# Patient Record
Sex: Female | Born: 1935 | Race: White | Hispanic: No | Marital: Married | Smoking: Former smoker
Health system: Southern US, Community
[De-identification: ages and names within clinical notes are randomized; demographics above are authoritative.]

## PROBLEM LIST (undated history)

## (undated) ENCOUNTER — Emergency Department (HOSPITAL_BASED_OUTPATIENT_CLINIC_OR_DEPARTMENT_OTHER): Admission: EM | Payer: Medicare Other | Source: Home / Self Care

## (undated) DIAGNOSIS — M8580 Other specified disorders of bone density and structure, unspecified site: Secondary | ICD-10-CM

## (undated) DIAGNOSIS — K635 Polyp of colon: Secondary | ICD-10-CM

## (undated) DIAGNOSIS — R011 Cardiac murmur, unspecified: Secondary | ICD-10-CM

## (undated) DIAGNOSIS — H35372 Puckering of macula, left eye: Secondary | ICD-10-CM

## (undated) DIAGNOSIS — K319 Disease of stomach and duodenum, unspecified: Secondary | ICD-10-CM

## (undated) DIAGNOSIS — I10 Essential (primary) hypertension: Secondary | ICD-10-CM

## (undated) DIAGNOSIS — H59032 Cystoid macular edema following cataract surgery, left eye: Secondary | ICD-10-CM

## (undated) DIAGNOSIS — C801 Malignant (primary) neoplasm, unspecified: Secondary | ICD-10-CM

## (undated) HISTORY — DX: Disease of stomach and duodenum, unspecified: K31.9

## (undated) HISTORY — DX: Puckering of macula, left eye: H35.372

## (undated) HISTORY — PX: TONSILLECTOMY: SHX5217

## (undated) HISTORY — DX: Cardiac murmur, unspecified: R01.1

## (undated) HISTORY — DX: Polyp of colon: K63.5

## (undated) HISTORY — PX: BREAST BIOPSY: SHX20

## (undated) HISTORY — PX: BREAST SURGERY: SHX581

## (undated) HISTORY — DX: Other specified disorders of bone density and structure, unspecified site: M85.80

## (undated) HISTORY — PX: APPENDECTOMY: SHX54

## (undated) HISTORY — PX: ABDOMINAL HYSTERECTOMY: SHX81

## (undated) HISTORY — DX: Cystoid macular edema following cataract surgery, left eye: H59.032

## (undated) HISTORY — DX: Essential (primary) hypertension: I10

---

## 2005-04-21 ENCOUNTER — Ambulatory Visit: Payer: Self-pay | Admitting: General Surgery

## 2006-04-27 ENCOUNTER — Ambulatory Visit: Payer: Self-pay | Admitting: General Surgery

## 2007-04-29 ENCOUNTER — Ambulatory Visit: Payer: Self-pay | Admitting: General Surgery

## 2008-05-02 ENCOUNTER — Ambulatory Visit: Payer: Self-pay | Admitting: General Surgery

## 2009-05-04 ENCOUNTER — Ambulatory Visit: Payer: Self-pay | Admitting: General Surgery

## 2010-05-15 ENCOUNTER — Ambulatory Visit: Payer: Self-pay | Admitting: Surgery

## 2011-05-21 ENCOUNTER — Ambulatory Visit: Payer: Self-pay | Admitting: Surgery

## 2012-05-24 ENCOUNTER — Ambulatory Visit: Payer: Self-pay | Admitting: Surgery

## 2012-07-27 ENCOUNTER — Ambulatory Visit: Payer: Self-pay | Admitting: Internal Medicine

## 2012-08-13 LAB — HM COLONOSCOPY

## 2012-08-23 ENCOUNTER — Ambulatory Visit: Payer: Self-pay | Admitting: Gastroenterology

## 2012-12-16 ENCOUNTER — Ambulatory Visit: Payer: Self-pay | Admitting: Internal Medicine

## 2013-01-06 ENCOUNTER — Ambulatory Visit: Payer: Self-pay | Admitting: Internal Medicine

## 2013-04-13 ENCOUNTER — Ambulatory Visit: Payer: Self-pay | Admitting: Internal Medicine

## 2013-06-08 ENCOUNTER — Ambulatory Visit: Payer: Self-pay | Admitting: Family Medicine

## 2013-06-08 LAB — HM MAMMOGRAPHY

## 2014-06-13 ENCOUNTER — Ambulatory Visit: Payer: Self-pay | Admitting: Surgery

## 2015-01-25 LAB — HM DEXA SCAN

## 2015-04-03 ENCOUNTER — Other Ambulatory Visit: Payer: Self-pay

## 2015-04-16 ENCOUNTER — Ambulatory Visit: Admission: RE | Admit: 2015-04-16 | Payer: Self-pay | Source: Ambulatory Visit | Admitting: Orthopedic Surgery

## 2015-04-16 ENCOUNTER — Encounter: Admission: RE | Payer: Self-pay | Source: Ambulatory Visit

## 2015-04-16 SURGERY — ARTHROSCOPY, KNEE
Anesthesia: Choice | Laterality: Right

## 2015-07-06 ENCOUNTER — Other Ambulatory Visit: Payer: Self-pay | Admitting: Surgery

## 2015-07-06 DIAGNOSIS — Z1231 Encounter for screening mammogram for malignant neoplasm of breast: Secondary | ICD-10-CM

## 2015-07-12 ENCOUNTER — Other Ambulatory Visit: Payer: Self-pay | Admitting: Surgery

## 2015-07-12 ENCOUNTER — Ambulatory Visit
Admission: RE | Admit: 2015-07-12 | Discharge: 2015-07-12 | Disposition: A | Discharge status: Home / Self Care | Payer: Medicare Other | Source: Ambulatory Visit | Attending: Surgery | Admitting: Surgery

## 2015-07-12 DIAGNOSIS — Z1231 Encounter for screening mammogram for malignant neoplasm of breast: Secondary | ICD-10-CM

## 2015-07-12 HISTORY — DX: Malignant (primary) neoplasm, unspecified: C80.1

## 2015-08-06 ENCOUNTER — Encounter: Payer: Self-pay | Admitting: Family Medicine

## 2015-08-06 ENCOUNTER — Other Ambulatory Visit: Payer: Self-pay

## 2015-08-06 ENCOUNTER — Other Ambulatory Visit: Payer: Self-pay | Admitting: Family Medicine

## 2015-08-06 ENCOUNTER — Telehealth: Payer: Self-pay

## 2015-08-06 DIAGNOSIS — Z5181 Encounter for therapeutic drug level monitoring: Secondary | ICD-10-CM | POA: Insufficient documentation

## 2015-08-06 DIAGNOSIS — M8580 Other specified disorders of bone density and structure, unspecified site: Secondary | ICD-10-CM

## 2015-08-06 DIAGNOSIS — I10 Essential (primary) hypertension: Secondary | ICD-10-CM | POA: Insufficient documentation

## 2015-08-06 NOTE — Assessment & Plan Note (Signed)
Check vit D 

## 2015-08-06 NOTE — Assessment & Plan Note (Signed)
Check CMP.  ?

## 2015-08-06 NOTE — Telephone Encounter (Signed)
Patient thought she was supposed to have come back in for labs after she was seen here in Jan. I looked in PP but I did not see anything. She wants to know if she can get some labs done and then come in for her follow up.

## 2015-08-07 ENCOUNTER — Telehealth: Payer: Self-pay | Admitting: Family Medicine

## 2015-08-07 LAB — COMPREHENSIVE METABOLIC PANEL
ALBUMIN: 4 g/dL (ref 3.5–4.7)
ALT: 22 IU/L (ref 0–32)
AST: 26 IU/L (ref 0–40)
Albumin/Globulin Ratio: 1.7 (ref 1.1–2.5)
Alkaline Phosphatase: 45 IU/L (ref 39–117)
BUN / CREAT RATIO: 21 (ref 11–26)
BUN: 20 mg/dL (ref 8–27)
Bilirubin Total: 0.3 mg/dL (ref 0.0–1.2)
CALCIUM: 9.3 mg/dL (ref 8.7–10.3)
CHLORIDE: 101 mmol/L (ref 97–108)
CO2: 26 mmol/L (ref 18–29)
Creatinine, Ser: 0.94 mg/dL (ref 0.57–1.00)
GFR, EST AFRICAN AMERICAN: 65 mL/min/{1.73_m2} (ref >59)
GFR, EST NON AFRICAN AMERICAN: 56 mL/min/{1.73_m2} — AB (ref >59)
GLUCOSE: 84 mg/dL (ref 65–99)
Globulin, Total: 2.4 g/dL (ref 1.5–4.5)
Potassium: 4.3 mmol/L (ref 3.5–5.2)
Sodium: 142 mmol/L (ref 134–144)
TOTAL PROTEIN: 6.4 g/dL (ref 6.0–8.5)

## 2015-08-07 LAB — VITAMIN D 25 HYDROXY (VIT D DEFICIENCY, FRACTURES): Vit D, 25-Hydroxy: 38.5 ng/mL (ref 30.0–100.0)

## 2015-08-07 NOTE — Telephone Encounter (Signed)
Please let patient know that her vitamin D is normal Her glucose (sugar) is fine; her liver function tests are normal We like the GFR (measure of kidney function) to be over 59 and hers is just mildly reduced at 8 Reassure her that a GFR of 14 at 81 years of age is pretty good! We have people in their 51s and 78s whose kidneys don't work as well as hers do Avoid NSAIDs; stay hydrated

## 2015-08-08 NOTE — Telephone Encounter (Signed)
Left message to call.

## 2015-08-08 NOTE — Telephone Encounter (Signed)
Patient notified

## 2015-08-16 DIAGNOSIS — I10 Essential (primary) hypertension: Secondary | ICD-10-CM | POA: Insufficient documentation

## 2015-08-20 ENCOUNTER — Ambulatory Visit (INDEPENDENT_AMBULATORY_CARE_PROVIDER_SITE_OTHER): Payer: Medicare Other | Admitting: Family Medicine

## 2015-08-20 ENCOUNTER — Encounter: Payer: Self-pay | Admitting: Family Medicine

## 2015-08-20 VITALS — BP 154/83 | HR 69 | Temp 97.6°F | Wt 144.0 lb

## 2015-08-20 DIAGNOSIS — M8580 Other specified disorders of bone density and structure, unspecified site: Secondary | ICD-10-CM | POA: Diagnosis not present

## 2015-08-20 DIAGNOSIS — I10 Essential (primary) hypertension: Secondary | ICD-10-CM

## 2015-08-20 NOTE — Progress Notes (Signed)
   BP 154/83 mmHg  Pulse 69  Temp(Src) 97.6 F (36.4 C)  Wt 144 lb (65.318 kg)  SpO2 98%   Subjective:    Patient ID: Kayla Mayer, female    DOB: May 17, 1930, 81 y.o.   MRN: [id number]  HPI: Kayla Mayer is a 81 y.o. female  Chief Complaint  Patient presents with  . Hypertension  . Discuss Labs   She had labs done and we reviewed those together  She does not get regular exercise; not yet a member of [employer]  She has a messed up meniscus in the right knee; had surgery scheduled but had to postpone it; had MRI done on the right knee already; meniscus; she is not doing glucosamine; it is a tear from injury  High blood pressure, much better control at home; not taking but 5 mg of medicine a day; she will go up to 10 mg of med when higher than usual; good eater  Hx of high cholesterol; did dietary changes and walked and brought it down; never went back up  Relevant past medical, surgical, family and social history reviewed and updated as indicated. Interim medical history since our last visit reviewed. Allergies and medications reviewed and updated.  Review of Systems Per HPI unless specifically indicated above     Objective:    BP 154/83 mmHg  Pulse 69  Temp(Src) 97.6 F (36.4 C)  Wt 144 lb (65.318 kg)  SpO2 98%  Wt Readings from Last 3 Encounters:  08/20/15 144 lb (65.318 kg)  12/07/14 149 lb (67.586 kg)    Physical Exam  Constitutional: She appears well-developed and well-nourished.  Eyes: EOM are normal. No scleral icterus.  Cardiovascular: Normal rate and regular rhythm.   Pulmonary/Chest: Effort normal and breath sounds normal.  Skin: No bruising and no ecchymosis noted.  Psychiatric: She has a normal mood and affect. Her speech is normal and behavior is normal. Judgment and thought content normal. Cognition and memory are normal.    Results for orders placed or performed in visit on 08/16/15  HM MAMMOGRAPHY  Result Value Ref Range   HM Mammogram  per PP   HM DEXA SCAN  Result Value Ref Range   HM Dexa Scan per PP   HM COLONOSCOPY  Result Value Ref Range   HM Colonoscopy per PP       Assessment & Plan:   Problem List Items Addressed This Visit      Cardiovascular and Mediastinum   Essential hypertension, benign - Primary    Discussed, slightly above normal; DASH guidelines; she adjusts her ACE-I if pressures running up; she monitors closely at home; reviewed last creatinine and K+        Musculoskeletal and Integument   Osteopenia    Reviewed fall precautions; DEXA scan results; calcium 3 servings a day         Follow up plan: Return in about 6 months (around 02/18/2016).

## 2015-08-24 NOTE — Assessment & Plan Note (Signed)
Reviewed fall precautions; DEXA scan results; calcium 3 servings a day

## 2015-08-24 NOTE — Patient Instructions (Signed)
Computer went down during her visit. No AVS printed at the time of her appointment.

## 2015-08-24 NOTE — Assessment & Plan Note (Signed)
Discussed, slightly above normal; DASH guidelines; she adjusts her ACE-I if pressures running up; she monitors closely at home; reviewed last creatinine and K+

## 2015-09-11 HISTORY — PX: SKIN SURGERY: SHX2413

## 2015-10-03 ENCOUNTER — Ambulatory Visit: Payer: Medicare Other

## 2015-10-03 DIAGNOSIS — Z23 Encounter for immunization: Secondary | ICD-10-CM

## 2015-11-10 ENCOUNTER — Emergency Department
Admission: EM | Admit: 2015-11-10 | Discharge: 2015-11-10 | Disposition: A | Discharge status: Home / Self Care | Payer: Medicare Other | Attending: Emergency Medicine | Admitting: Emergency Medicine

## 2015-11-10 ENCOUNTER — Encounter: Payer: Self-pay | Admitting: Emergency Medicine

## 2015-11-10 DIAGNOSIS — T7840XA Allergy, unspecified, initial encounter: Secondary | ICD-10-CM | POA: Diagnosis present

## 2015-11-10 DIAGNOSIS — Y9389 Activity, other specified: Secondary | ICD-10-CM | POA: Insufficient documentation

## 2015-11-10 DIAGNOSIS — Y9289 Other specified places as the place of occurrence of the external cause: Secondary | ICD-10-CM | POA: Insufficient documentation

## 2015-11-10 DIAGNOSIS — I10 Essential (primary) hypertension: Secondary | ICD-10-CM | POA: Insufficient documentation

## 2015-11-10 DIAGNOSIS — Z79899 Other long term (current) drug therapy: Secondary | ICD-10-CM | POA: Insufficient documentation

## 2015-11-10 DIAGNOSIS — Y998 Other external cause status: Secondary | ICD-10-CM | POA: Diagnosis not present

## 2015-11-10 DIAGNOSIS — Z91040 Latex allergy status: Secondary | ICD-10-CM | POA: Insufficient documentation

## 2015-11-10 DIAGNOSIS — L509 Urticaria, unspecified: Secondary | ICD-10-CM | POA: Diagnosis not present

## 2015-11-10 DIAGNOSIS — X58XXXA Exposure to other specified factors, initial encounter: Secondary | ICD-10-CM | POA: Diagnosis not present

## 2015-11-10 DIAGNOSIS — Z87891 Personal history of nicotine dependence: Secondary | ICD-10-CM | POA: Insufficient documentation

## 2015-11-10 MED ORDER — DIPHENHYDRAMINE HCL 25 MG PO CAPS
25.0000 mg | ORAL_CAPSULE | Freq: Once | ORAL | Status: AC
  Administered 2015-11-10: 25 mg via ORAL

## 2015-11-10 MED ORDER — DIPHENHYDRAMINE HCL 25 MG PO CAPS
ORAL_CAPSULE | ORAL | Status: AC
  Administered 2015-11-10: 25 mg via ORAL
  Filled 2015-11-10: qty 1

## 2015-11-10 MED ORDER — FAMOTIDINE 20 MG PO TABS: 20.0000 mg | ORAL_TABLET | Freq: Two times a day (BID) | ORAL | Status: DC

## 2015-11-10 MED ORDER — FAMOTIDINE 20 MG PO TABS
20.0000 mg | ORAL_TABLET | Freq: Once | ORAL | Status: AC
  Administered 2015-11-10: 20 mg via ORAL
  Filled 2015-11-10: qty 1

## 2015-11-10 MED ORDER — PREDNISONE 20 MG PO TABS: ORAL_TABLET | ORAL | Status: AC

## 2015-11-10 MED ORDER — PREDNISONE 20 MG PO TABS
30.0000 mg | ORAL_TABLET | Freq: Once | ORAL | Status: AC
  Administered 2015-11-10: 30 mg via ORAL
  Filled 2015-11-10: qty 1

## 2015-11-10 NOTE — Discharge Instructions (Signed)
1. Take antibiotic as prescribed (Penicillin VK 500mg  four times daily x 7 days). 2. Take pain medicines as needed (Motrin/Percocet #15). 3. Return to the ER for worsening symptoms, persistent vomiting, fever, difficulty breathing or other concerns.  Dental Pain A tooth ache may be caused by cavities (tooth decay). Cavities expose the nerve of the tooth to air and hot or cold temperatures. It may come from an infection or abscess (also called a boil or furuncle) around your tooth. It is also often caused by dental caries (tooth decay). This causes the pain you are having. DIAGNOSIS  Your caregiver can diagnose this problem by exam. TREATMENT   If caused by an infection, it may be treated with medications which kill germs (antibiotics) and pain medications as prescribed by your caregiver. Take medications as directed.  Only take over-the-counter or prescription medicines for pain, discomfort, or fever as directed by your caregiver.  Whether the tooth ache today is caused by infection or dental disease, you should see your dentist as soon as possible for further care. SEEK MEDICAL CARE IF: The exam and treatment you received today has been provided on an emergency basis only. This is not a substitute for complete medical or dental care. If your problem worsens or new problems (symptoms) appear, and you are unable to meet with your dentist, call or return to this location. SEEK IMMEDIATE MEDICAL CARE IF:   You have a fever.  You develop redness and swelling of your face, jaw, or neck.  You are unable to open your mouth.  You have severe pain uncontrolled by pain medicine. MAKE SURE YOU:   Understand these instructions.  Will watch your condition.  Will get help right away if you are not doing well or get worse. Document Released: 10/27/2005 Document Revised: 01/19/2012 Document Reviewed: 06/14/2008 [facility] Patient Information 2015 [place], [place]. This information is not intended to  replace advice given to you by your health care provider. Make sure you discuss any questions you have with your health care provider. 1. Take the following medicines for the next 4 days: Prednisone 40mg  daily Pepcid 20mg  twice daily 2. Take Benadryl as needed for itching. 3. Return to the ER for worsening symptoms, persistent vomiting, difficulty breathing or other concerns.  Allergies An allergy is an abnormal reaction to a substance by the body's defense system (immune system). Allergies can develop at any age. WHAT CAUSES ALLERGIES? An allergic reaction happens when the immune system mistakenly reacts to a normally harmless substance, called an allergen, as if it were harmful. The immune system releases antibodies to fight the substance. Antibodies eventually release a chemical called histamine into the bloodstream. The release of histamine is meant to protect the body from infection, but it also causes discomfort. An allergic reaction can be triggered by:  Eating an allergen.  Inhaling an allergen.  Touching an allergen. WHAT TYPES OF ALLERGIES ARE THERE? There are many types of allergies. Common types include:  Seasonal allergies. People with this type of allergy are usually allergic to substances that are only present during certain seasons, such as molds and pollens.  Food allergies.  Drug allergies.  Insect allergies.  Animal dander allergies. WHAT ARE SYMPTOMS OF ALLERGIES? Possible allergy symptoms include:  Swelling of the lips, face, tongue, mouth, or throat.  Sneezing, coughing, or wheezing.  Nasal congestion.  Tingling in the mouth.  Rash.  Itching.  Itchy, red, swollen areas of skin (hives).  Watery eyes.  Vomiting.  Diarrhea.  Dizziness.  Lightheadedness.  Fainting.  Trouble breathing or swallowing.  Chest tightness.  Rapid heartbeat. HOW ARE ALLERGIES DIAGNOSED? Allergies are diagnosed with a medical and family history and one or more  of the following:  Skin tests.  Blood tests.  A food diary. A food diary is a record of all the foods and drinks you have in a day and of all the symptoms you experience.  The results of an elimination diet. An elimination diet involves eliminating foods from your diet and then adding them back in one by one to find out if a certain food causes an allergic reaction. HOW ARE ALLERGIES TREATED? There is no cure for allergies, but allergic reactions can be treated with medicine. Severe reactions usually need to be treated at a hospital. HOW CAN REACTIONS BE PREVENTED? The best way to prevent an allergic reaction is by avoiding the substance you are allergic to. Allergy shots and medicines can also help prevent reactions in some cases. People with severe allergic reactions may be able to prevent a life-threatening reaction called anaphylaxis with a medicine given right after exposure to the allergen.   This information is not intended to replace advice given to you by your health care provider. Make sure you discuss any questions you have with your health care provider.   Document Released: 01/20/2003 Document Revised: 11/17/2014 Document Reviewed: 08/08/2014 Elsevier Interactive Patient Education 2016 [place] are itchy, red, swollen areas of the skin. They can vary in size and location on your body. Hives can come and go for hours or several days (acute hives) or for several weeks (chronic hives). Hives do not spread from person to person (noncontagious). They may get worse with scratching, exercise, and emotional stress. CAUSES   Allergic reaction to food, additives, or drugs.  Infections, including the common cold.  Illness, such as vasculitis, lupus, or thyroid disease.  Exposure to sunlight, heat, or cold.  Exercise.  Stress.  Contact with chemicals. SYMPTOMS   Red or white swollen patches on the skin. The patches may change size, shape, and location quickly  and repeatedly.  Itching.  Swelling of the hands, feet, and face. This may occur if hives develop deeper in the skin. DIAGNOSIS  Your caregiver can usually tell what is wrong by performing a physical exam. Skin or blood tests may also be done to determine the cause of your hives. In some cases, the cause cannot be determined. TREATMENT  Mild cases usually get better with medicines such as antihistamines. Severe cases may require an emergency epinephrine injection. If the cause of your hives is known, treatment includes avoiding that trigger.  HOME CARE INSTRUCTIONS   Avoid causes that trigger your hives.  Take antihistamines as directed by your caregiver to reduce the severity of your hives. Non-sedating or low-sedating antihistamines are usually recommended. Do not drive while taking an antihistamine.  Take any other medicines prescribed for itching as directed by your caregiver.  Wear loose-fitting clothing.  Keep all follow-up appointments as directed by your caregiver. SEEK MEDICAL CARE IF:   You have persistent or severe itching that is not relieved with medicine.  You have painful or swollen joints. SEEK IMMEDIATE MEDICAL CARE IF:   You have a fever.  Your tongue or lips are swollen.  You have trouble breathing or swallowing.  You feel tightness in the throat or chest.  You have abdominal pain. These problems may be the first sign of a life-threatening allergic reaction. Call your local  emergency services (911 in [place] MAKE SURE YOU:   Understand these instructions.  Will watch your condition.  Will get help right away if you are not doing well or get worse.   This information is not intended to replace advice given to you by your health care provider. Make sure you discuss any questions you have with your health care provider.   Document Released: 10/27/2005 Document Revised: 11/01/2013 Document Reviewed: 01/20/2012 Elsevier Interactive Patient Education [employer]

## 2015-11-10 NOTE — ED Provider Notes (Signed)
[facility] Emergency Department Provider Note  ____________________________________________  Time seen: Approximately 4:47 AM  I have reviewed the triage vital signs and the nursing notes.   HISTORY  Chief Complaint Allergic Reaction    HPI ANASHA BILBREY is a 81 y.o. female who presents to the ED from home with chief complaint of rash. Onset of symptoms approximately 8 PM. Patient noted itching to various parts of her body, then looked down and noted red whelps all over. Denies associated swelling of face, tongue or throat. Denies chest pain, shortness of breath, abdominal pain, nausea, vomiting, diarrhea. Denies new exposures such as new medicines, antibiotics, OTC, detergents, lotions, new foods. Topical lotion applied prior to arrival. Benadryl received in triage improved her symptoms.   Past Medical History  Diagnosis Date  . Cancer ([place]) skin  . Hypertension   . Heart murmur   . Stomach problems   . Osteopenia   . Colonic polyp     Patient Active Problem List   Diagnosis Date Noted  . Osteopenia 08/06/2015  . Medication monitoring encounter 08/06/2015  . Essential hypertension, benign 08/06/2015    Past Surgical History  Procedure Laterality Date  . Breast biopsy Left     neg  . Breast biopsy Left     neg  . Abdominal hysterectomy    . Tonsillectomy    . Breast surgery      Current Outpatient Rx  Name  Route  Sig  Dispense  Refill  . Calcium-Vitamin D 600-200 MG-UNIT tablet   Oral   Take by mouth daily.         Marland Kitchen DHA-Phosphatidylserine 150-50 MG CAPS   Oral   Take by mouth daily.         Marland Kitchen GARLIC PO   Oral   Take 1 mg by mouth daily.         Marland Kitchen lisinopril (PRINIVIL,ZESTRIL) 10 MG tablet   Oral   Take 5 mg by mouth daily.          . Magnesium 500 MG CAPS   Oral   Take 500 mg by mouth daily.         . Multiple Vitamin (MULTIVITAMIN) capsule   Oral   Take by mouth daily.         . Probiotic Product (SUPER  PROBIOTIC PO)   Oral   Take by mouth.         Lia Hopping Leaf POWD      Use 40 drops daily.         . vitamin B-12 (CYANOCOBALAMIN) 1000 MCG tablet   Oral   Take 1,000 mcg by mouth daily.           Allergies Latex; Lidocaine; Other; Aspirin; Doxycycline; Gentamicin; Pseudoephedrine; and Afrin 12 hour   Family History  Problem Relation Age of Onset  . Breast cancer Sister 83  . Cancer Sister     cervical,colon, breast  . Hyperlipidemia Sister   . Hypertension Sister   . Thyroid disease Sister   . Stroke Sister   . Cancer Mother     ovarian  . Cancer Father     pancreatic  . Cancer Brother     espohageal  . Cancer Brother     colon    Social History Social History  Substance Use Topics  . Smoking status: Former Smoker -- 2.00 packs/day for 15 years  . Smokeless tobacco: Never Used  . Alcohol Use: No  Review of Systems Constitutional: No fever/chills Eyes: No visual changes. ENT: No sore throat. Cardiovascular: Denies chest pain. Respiratory: Denies shortness of breath. Gastrointestinal: No abdominal pain.  No nausea, no vomiting.  No diarrhea.  No constipation. Genitourinary: Negative for dysuria. Musculoskeletal: Negative for back pain. Skin: Positive for rash. Neurological: Negative for headaches, focal weakness or numbness.  10-point ROS otherwise negative.  ____________________________________________   PHYSICAL EXAM:  VITAL SIGNS: ED Triage Vitals  Enc Vitals Group     BP 11/10/15 0201 173/72 mmHg     Pulse Rate 11/10/15 0159 69     Resp 11/10/15 0159 16     Temp 11/10/15 0159 97.6 F (36.4 C)     Temp Source 11/10/15 0159 Oral     SpO2 11/10/15 0159 98 %     Weight 11/10/15 0159 133 lb (60.328 kg)     Height 11/10/15 0159 5\' 6"  (1.676 m)     Head Cir --      Peak Flow --      Pain Score 11/10/15 0200 0     Pain Loc --      Pain Edu? --      Excl. in [place]? --     Constitutional: Alert and oriented. Well appearing and in no acute  distress. Eyes: Conjunctivae are normal. PERRL. EOMI. Head: Atraumatic. Nose: No congestion/rhinnorhea. Mouth/Throat: Mucous membranes are moist.  Oropharynx non-erythematous.  There is no facial or tongue swelling. Neck: No stridor.   Cardiovascular: Normal rate, regular rhythm. Grossly normal heart sounds.  Good peripheral circulation. Respiratory: Normal respiratory effort.  No retractions. Lungs CTAB. Gastrointestinal: Soft and nontender. No distention. No abdominal bruits. No CVA tenderness. Musculoskeletal: No lower extremity tenderness nor edema.  No joint effusions. Neurologic:  Normal speech and language. No gross focal neurologic deficits are appreciated. No gait instability. Skin:  Skin is warm, dry and intact. No petechiae. Generalized urticaria. Psychiatric: Mood and affect are normal. Speech and behavior are normal.  ____________________________________________   LABS (all labs ordered are listed, but only abnormal results are displayed)  Labs Reviewed - No data to display ____________________________________________  EKG  None ____________________________________________  RADIOLOGY  None ____________________________________________   PROCEDURES  Procedure(s) performed: None  Critical Care performed: No  ____________________________________________   INITIAL IMPRESSION / ASSESSMENT AND PLAN / ED COURSE  Pertinent labs & imaging results that were available during my care of the patient were reviewed by me and considered in my medical decision making (see chart for details).  81 year old female who presents with acute allergic reaction and generalized urticaria. There is no associated angioedema or airway distress. Symptoms improved upon receiving Benadryl in triage. Will add prednisone and Pepcid. Of note, patient shows me a list of multiple medication and exposure allergies including perfumes, soaps and detergents. Given her sensitivity to multiple  medications, will start prednisone at a lower dose to minimize side effects. Strict return precautions given. Patient and spouse verbalize understanding and agree with plan of care. ____________________________________________   FINAL CLINICAL IMPRESSION(S) / ED DIAGNOSES  Final diagnoses:  Allergic reaction, initial encounter  Hives      Paulette Blanch, MD 11/10/15 [phone]

## 2015-11-10 NOTE — ED Notes (Signed)
Red rash to body. Onset of symptoms about 2000 last evening.  Denies any new detergents, foods, medicines, soaps.  Patient is awake and alert.  No SOB/ DOE.  Bite and itch lotion applied to rash, no effect.    Patient did initially wash off with warm water.  I  Buspirone taken as well to calm nerves.

## 2015-11-16 ENCOUNTER — Ambulatory Visit (INDEPENDENT_AMBULATORY_CARE_PROVIDER_SITE_OTHER): Payer: Medicare Other | Admitting: Family Medicine

## 2015-11-16 ENCOUNTER — Encounter: Payer: Self-pay | Admitting: Family Medicine

## 2015-11-16 ENCOUNTER — Ambulatory Visit: Payer: Medicare Other | Admitting: Family Medicine

## 2015-11-16 VITALS — BP 141/77 | HR 86 | Temp 97.0°F | Wt 139.0 lb

## 2015-11-16 DIAGNOSIS — I10 Essential (primary) hypertension: Secondary | ICD-10-CM

## 2015-11-16 DIAGNOSIS — T781XXA Other adverse food reactions, not elsewhere classified, initial encounter: Secondary | ICD-10-CM

## 2015-11-16 MED ORDER — FAMOTIDINE 20 MG PO TABS: 20.0000 mg | ORAL_TABLET | Freq: Two times a day (BID) | ORAL | Status: DC

## 2015-11-16 MED ORDER — EPINEPHRINE 0.3 MG/0.3ML IJ SOAJ: 0.3000 mg | Freq: Once | INTRAMUSCULAR | Status: AC

## 2015-11-16 MED ORDER — PREDNISONE 20 MG PO TABS: ORAL_TABLET | ORAL | Status: DC

## 2015-11-16 NOTE — Progress Notes (Signed)
BP 141/77 mmHg  Pulse 86  Temp(Src) 97 F (36.1 C)  Wt 139 lb (63.05 kg)  SpO2 98%   Subjective:    Patient ID: Kayla Mayer, female    DOB: 1930-01-13, 82 y.o.   MRN: [id number]  HPI: Kayla Mayer is a 82 y.o. female  Chief Complaint  Patient presents with  . Rash    Was seen at the ER last week for Hives. It has improved alot.  . Hypertension    She stopped her Lisinopril , but now her BP seems out of wack so she took one today.   Patient is here with her husband; was went to the ER last week after suffering an allergic reaction to something; ER notes reviewed Broke out on large part of the body; bright red like she'd been blistered to the sun; burning and stinging and itching This was about 8 pm, started itching at the waistline at the back, then up around the neck, then on the arms She went to the ER around midnight and was given a benadryl; 45 minutes later was beginning to kick in, less hurting but still red No lip swelling, no trouble breathing She had the dry mouth for several consecutive days after eating something new from [place]; she brought a list of the foods she had ingested prior to the break-out  No redness and no hives now I asked about any new products from Christmas She bought a top and it smelled like cologne in it; I asked about other things that could have caused the reaction No diarrhea or abdominal pain  She wasn't feeling this great this morning; checked her BP this morning and took 10 mg lisinopril today Vision problem for the last week or two, had gone from contacts to glasses; no headache Feeling uptight since she went to the ER; she's been worried about what it might have been; husband thinks it could be stress  She was supposed to have cataract surgery, not going to have it; would like to know if I have a recommendation  Relevant past medical, surgical, family and social history reviewed and updated as indicated. Interim medical history since our  last visit reviewed. Allergies and medications reviewed and updated.  Review of Systems Per HPI unless specifically indicated above     Objective:    BP 141/77 mmHg  Pulse 86  Temp(Src) 97 F (36.1 C)  Wt 139 lb (63.05 kg)  SpO2 98%  Wt Readings from Last 3 Encounters:  11/16/15 139 lb (63.05 kg)  11/10/15 133 lb (60.328 kg)  08/20/15 144 lb (65.318 kg)    Physical Exam  Constitutional: She appears well-developed and well-nourished. No distress.  HENT:  Mouth/Throat: Oropharynx is clear and moist and mucous membranes are normal. Mucous membranes are not dry.  Cardiovascular: Normal rate and regular rhythm.   Pulmonary/Chest: Effort normal and breath sounds normal.  Skin: Skin is warm, dry and intact. No rash noted. No erythema. No pallor.  Psychiatric: Her speech is normal and behavior is normal. Her mood appears anxious.      Assessment & Plan:   Problem List Items Addressed This Visit      Cardiovascular and Mediastinum   Essential hypertension, benign    I agree with husband that stress is affecting her blood pressure; she'll continue to monitor and may take lisinopril; discussed goal systolic, less than Q000111Q mmHg; we don't want pressures too low, because then patients get dizzy, may fall, may break a  hip, etc.      Relevant Medications   EPINEPHrine 0.3 mg/0.3 mL IJ SOAJ injection     Other   Allergic reaction to food - Primary    Suspect this was ingestion of something; she has list of suspects and can take that to allergist; ER records suggest that she follow-up with ENT; I personally called their office to see if this was appropriate or if patient should see an allergist; staff there said they would be very glad to take care of this issue, help the patient identify and treat problem; we have inclement weather coming, so I gave patient prescription for prednisone, famotidine, and Epipen to have on-hand just in case she developed a severe, life-threatening allergic  reaction; explained risk of stroke and heart attack with Epipen, only to use if throat closing up, call 911, etc.; Epipen is not to be used for outbreak of hives; call with any issues or problems; otherwise, avoid any possible offending agents and keep appt with ENT      Relevant Orders   Ambulatory referral to Allergy      Follow up plan: No Follow-up on file.  Face-to-face time with patient was more than 25 minutes, >50% time spent counseling and coordination of care

## 2015-11-16 NOTE — Patient Instructions (Addendum)
Please see Dr. Pryor Ochoa December 04, 2015 at 10:15 am I will recommend that you avoid anything you ate that day prior to your reaction That will include peanuts and milk  You can further limit intake of saturated fats by switching to dairy free products (coconut milk coffee creamer, almond milk, Tofutti brand cream cheese and sour cream, Follow Your Heart vegenaise (mayonnaise alternative) and Follow Your Heart cheese alternative (the Antoine Primas is my favorite)

## 2015-11-17 NOTE — Assessment & Plan Note (Signed)
I agree with husband that stress is affecting her blood pressure; she'll continue to monitor and may take lisinopril; discussed goal systolic, less than Q000111Q mmHg; we don't want pressures too low, because then patients get dizzy, may fall, may break a hip, etc.

## 2015-11-17 NOTE — Assessment & Plan Note (Signed)
Suspect this was ingestion of something; she has list of suspects and can take that to allergist; ER records suggest that she follow-up with ENT; I personally called their office to see if this was appropriate or if patient should see an allergist; staff there said they would be very glad to take care of this issue, help the patient identify and treat problem; we have inclement weather coming, so I gave patient prescription for prednisone, famotidine, and Epipen to have on-hand just in case she developed a severe, life-threatening allergic reaction; explained risk of stroke and heart attack with Epipen, only to use if throat closing up, call 911, etc.; Epipen is not to be used for outbreak of hives; call with any issues or problems; otherwise, avoid any possible offending agents and keep appt with ENT

## 2016-02-08 ENCOUNTER — Telehealth: Payer: Self-pay | Admitting: Family Medicine

## 2016-02-08 NOTE — Telephone Encounter (Signed)
Patient notified

## 2016-02-08 NOTE — Telephone Encounter (Signed)
How wonderful!! I just spoke with the eye doctor where me and my family went for decades and their doctors DO cataract extractions. I think very highly of their office, though I haven't been personally for years, they have a wonderful reputation and have been in the community since the 1970s. I would recommend them for her to go see. [place] [phone]. Please give her contact info. His office is very very close to [facility] in [place].

## 2016-03-24 ENCOUNTER — Encounter (INDEPENDENT_AMBULATORY_CARE_PROVIDER_SITE_OTHER): Payer: Medicare Other | Admitting: Ophthalmology

## 2016-03-24 DIAGNOSIS — H2513 Age-related nuclear cataract, bilateral: Secondary | ICD-10-CM | POA: Diagnosis not present

## 2016-03-24 DIAGNOSIS — H43813 Vitreous degeneration, bilateral: Secondary | ICD-10-CM

## 2016-03-24 DIAGNOSIS — I10 Essential (primary) hypertension: Secondary | ICD-10-CM

## 2016-03-24 DIAGNOSIS — H35033 Hypertensive retinopathy, bilateral: Secondary | ICD-10-CM

## 2016-03-24 DIAGNOSIS — H353132 Nonexudative age-related macular degeneration, bilateral, intermediate dry stage: Secondary | ICD-10-CM

## 2016-03-24 DIAGNOSIS — H35372 Puckering of macula, left eye: Secondary | ICD-10-CM

## 2016-07-02 ENCOUNTER — Encounter: Payer: Self-pay | Admitting: Family Medicine

## 2016-07-02 ENCOUNTER — Ambulatory Visit (INDEPENDENT_AMBULATORY_CARE_PROVIDER_SITE_OTHER): Payer: Medicare Other | Admitting: Family Medicine

## 2016-07-02 ENCOUNTER — Other Ambulatory Visit: Payer: Self-pay | Admitting: Family Medicine

## 2016-07-02 VITALS — BP 130/72 | HR 73 | Temp 98.3°F | Resp 14 | Wt 134.3 lb

## 2016-07-02 DIAGNOSIS — R682 Dry mouth, unspecified: Secondary | ICD-10-CM | POA: Diagnosis not present

## 2016-07-02 DIAGNOSIS — I959 Hypotension, unspecified: Secondary | ICD-10-CM

## 2016-07-02 DIAGNOSIS — R011 Cardiac murmur, unspecified: Secondary | ICD-10-CM | POA: Diagnosis not present

## 2016-07-02 DIAGNOSIS — R5383 Other fatigue: Secondary | ICD-10-CM | POA: Diagnosis not present

## 2016-07-02 DIAGNOSIS — I10 Essential (primary) hypertension: Secondary | ICD-10-CM

## 2016-07-02 LAB — GLUCOSE, POCT (MANUAL RESULT ENTRY): POC Glucose: 124 mg/dl — AB (ref 70–99)

## 2016-07-02 NOTE — Assessment & Plan Note (Signed)
Order echo

## 2016-07-02 NOTE — Progress Notes (Signed)
BP 130/72   Pulse 73   Temp 98.3 F (36.8 C) (Oral)   Resp 14   Wt 134 lb 4.8 oz (60.9 kg)   SpO2 94%   BMI 21.68 kg/m    Subjective:    Patient ID: Kayla Mayer, female    DOB: 1930-04-18, 82 y.o.   MRN: [id number]  HPI: Kayla Mayer is a 82 y.o. female  Chief Complaint  Patient presents with  . Other    low blood pressure    Getting low blood pressure readings Feels bad when blood pressure is low Does get moderate salt Maybe not drinking enough liquids perhaps She has lost some weight, but not on purpose Just not much appetite No SHOB, no orthopnea; little swelling at the ankles, goes down overnight She has definite murmur, last echo more than 3 years ago Older sister 68 years older than she is; colon cancer and breast cancer and bladder cancer Skin cancer taken care of, no breast lumps, no active bleeding Feels fatigued, not sleeping well Not doing anything for sleep Craving sweets Mouth dry; blurred vision, just had cataract surgery July 25th Blood sugar 124 here in the office today (POCT) No cough  Depression screen PHQ 2/9 07/02/2016  Decreased Interest 0  Down, Depressed, Hopeless 0  PHQ - 2 Score 0   Relevant past medical, surgical, family and social history reviewed Past Medical History:  Diagnosis Date  . Cancer ([place]) skin  . Colonic polyp   . Heart murmur   . Hypertension   . Osteopenia   . Stomach problems    Past Surgical History:  Procedure Laterality Date  . ABDOMINAL HYSTERECTOMY    . BREAST BIOPSY Left    neg  . BREAST BIOPSY Left    neg  . BREAST SURGERY    . SKIN SURGERY  Nov 2016   basal cell removed from lip  . TONSILLECTOMY     Family History  Problem Relation Age of Onset  . Breast cancer Sister 65  . Cancer Sister     cervical,colon, breast  . Hyperlipidemia Sister   . Hypertension Sister   . Thyroid disease Sister   . Stroke Sister   . Cancer Mother     ovarian  . Cancer Father     pancreatic  . Cancer Brother       espohageal  . Cancer Brother     colon   Social History  Substance Use Topics  . Smoking status: Former Smoker    Packs/day: 2.00    Years: 15.00  . Smokeless tobacco: Never Used  . Alcohol use No    Interim medical history since last visit reviewed. Allergies and medications reviewed  Review of Systems Per HPI unless specifically indicated above     Objective:    BP 130/72   Pulse 73   Temp 98.3 F (36.8 C) (Oral)   Resp 14   Wt 134 lb 4.8 oz (60.9 kg)   SpO2 94%   BMI 21.68 kg/m   Wt Readings from Last 3 Encounters:  07/02/16 134 lb 4.8 oz (60.9 kg)  11/16/15 139 lb (63 kg)  11/10/15 133 lb (60.3 kg)    Physical Exam  Constitutional: She appears well-developed and well-nourished. No distress.  HENT:  Mouth/Throat: Oropharynx is clear and moist and mucous membranes are normal. Mucous membranes are not dry.  Eyes: No scleral icterus.  Neck: No JVD present. Carotid bruit is not present.  Cardiovascular: Normal rate  and regular rhythm.   Murmur heard.  Systolic murmur is present with a grade of 2/6  Pulmonary/Chest: Effort normal and breath sounds normal. No respiratory distress. She has no wheezes. She has no rales.  Abdominal: She exhibits no distension.  Musculoskeletal: She exhibits no edema.  Skin: Skin is warm, dry and intact. No rash noted. No erythema. No pallor.  Psychiatric: Her speech is normal and behavior is normal. Her mood appears not anxious. Her affect is not blunt. Cognition and memory are not impaired. She does not exhibit a depressed mood.   Results for orders placed or performed in visit on 07/02/16  POCT Glucose (CBG)  Result Value Ref Range   POC Glucose 124 (A) 70 - 99 mg/dl      Assessment & Plan:   Problem List Items Addressed This Visit      Cardiovascular and Mediastinum   Hypotension    Discussed ddx; encouraged hydration, check labs to r/o anemia; glucose normal today      Relevant Orders   CBC with  Differential/Platelet   Essential hypertension, benign    Check lipids      Relevant Orders   Lipid panel     Other   Heart murmur    Order echo      Relevant Orders   ECHOCARDIOGRAM COMPLETE   Fatigue    Check labs      Relevant Orders   CBC with Differential/Platelet   COMPLETE METABOLIC PANEL WITH GFR   Lipid panel   TSH    Other Visit Diagnoses    Dry mouth    -  Primary   Relevant Orders   POCT Glucose (CBG) (Completed)      Follow up plan: Return in about 3 weeks (around 07/23/2016) for follow-up.  An after-visit summary was printed and given to the patient at [place].  Please see the patient instructions which may contain other information and recommendations beyond what is mentioned above in the assessment and plan.  Meds ordered this encounter  Medications  . HORSE CHESTNUT PO    Sig: Take by mouth.  . Multiple Vitamins-Minerals (PRESERVISION AREDS PO)    Sig: Take by mouth.    Orders Placed This Encounter  Procedures  . CBC with Differential/Platelet  . COMPLETE METABOLIC PANEL WITH GFR  . Lipid panel  . TSH  . POCT Glucose (CBG)  . ECHOCARDIOGRAM COMPLETE

## 2016-07-02 NOTE — Assessment & Plan Note (Signed)
Check lipids 

## 2016-07-02 NOTE — Assessment & Plan Note (Signed)
Discussed ddx; encouraged hydration, check labs to r/o anemia; glucose normal today

## 2016-07-02 NOTE — Assessment & Plan Note (Signed)
Check labs 

## 2016-07-02 NOTE — Patient Instructions (Addendum)
Try melatonin 3 mg for 3 weeks at the exact same time of night, 30-60 minutes before bed Let's get labs Drink 64-72 ounces of fluid daily

## 2016-07-03 LAB — COMPREHENSIVE METABOLIC PANEL
ALBUMIN: 3.8 g/dL (ref 3.5–4.7)
ALT: 18 IU/L (ref 0–32)
AST: 24 IU/L (ref 0–40)
Albumin/Globulin Ratio: 1.5 (ref 1.2–2.2)
Alkaline Phosphatase: 51 IU/L (ref 39–117)
BUN / CREAT RATIO: 26 (ref 12–28)
BUN: 19 mg/dL (ref 8–27)
Bilirubin Total: 0.3 mg/dL (ref 0.0–1.2)
CALCIUM: 9.7 mg/dL (ref 8.7–10.3)
CHLORIDE: 101 mmol/L (ref 96–106)
CO2: 29 mmol/L (ref 18–29)
CREATININE: 0.74 mg/dL (ref 0.57–1.00)
GFR calc Af Amer: 86 mL/min/{1.73_m2} (ref >59)
GFR, EST NON AFRICAN AMERICAN: 75 mL/min/{1.73_m2} (ref >59)
GLOBULIN, TOTAL: 2.5 g/dL (ref 1.5–4.5)
Glucose: 101 mg/dL — ABNORMAL HIGH (ref 65–99)
POTASSIUM: 4.1 mmol/L (ref 3.5–5.2)
Sodium: 143 mmol/L (ref 134–144)
TOTAL PROTEIN: 6.3 g/dL (ref 6.0–8.5)

## 2016-07-03 LAB — LIPID PANEL W/O CHOL/HDL RATIO
Cholesterol, Total: 238 mg/dL — ABNORMAL HIGH (ref 100–199)
HDL: 95 mg/dL (ref >39)
LDL Calculated: 116 mg/dL — ABNORMAL HIGH (ref 0–99)
Triglycerides: 137 mg/dL (ref 0–149)
VLDL Cholesterol Cal: 27 mg/dL (ref 5–40)

## 2016-07-03 LAB — CBC WITH DIFFERENTIAL/PLATELET
Basophils Absolute: 0.1 10*3/uL (ref 0.0–0.2)
Basos: 1 %
EOS (ABSOLUTE): 0.1 10*3/uL (ref 0.0–0.4)
EOS: 2 %
HEMATOCRIT: 36.7 % (ref 34.0–46.6)
Hemoglobin: 12.1 g/dL (ref 11.1–15.9)
IMMATURE GRANULOCYTES: 0 %
Immature Grans (Abs): 0 10*3/uL (ref 0.0–0.1)
LYMPHS: 38 %
Lymphocytes Absolute: 2.5 10*3/uL (ref 0.7–3.1)
MCH: 30.8 pg (ref 26.6–33.0)
MCHC: 33 g/dL (ref 31.5–35.7)
MCV: 93 fL (ref 79–97)
MONOS ABS: 0.5 10*3/uL (ref 0.1–0.9)
Monocytes: 7 %
NEUTROS PCT: 52 %
Neutrophils Absolute: 3.5 10*3/uL (ref 1.4–7.0)
PLATELETS: 199 10*3/uL (ref 150–379)
RBC: 3.93 x10E6/uL (ref 3.77–5.28)
RDW: 14.8 % (ref 12.3–15.4)
WBC: 6.6 10*3/uL (ref 3.4–10.8)

## 2016-07-03 LAB — TSH: TSH: 1.54 u[IU]/mL (ref 0.450–4.500)

## 2016-07-07 ENCOUNTER — Other Ambulatory Visit: Payer: Self-pay | Admitting: Surgery

## 2016-07-07 DIAGNOSIS — Z1231 Encounter for screening mammogram for malignant neoplasm of breast: Secondary | ICD-10-CM

## 2016-07-16 ENCOUNTER — Encounter (INDEPENDENT_AMBULATORY_CARE_PROVIDER_SITE_OTHER): Payer: Medicare Other | Admitting: Ophthalmology

## 2016-07-16 DIAGNOSIS — H2511 Age-related nuclear cataract, right eye: Secondary | ICD-10-CM | POA: Diagnosis not present

## 2016-07-16 DIAGNOSIS — H35372 Puckering of macula, left eye: Secondary | ICD-10-CM | POA: Diagnosis not present

## 2016-07-16 DIAGNOSIS — H35033 Hypertensive retinopathy, bilateral: Secondary | ICD-10-CM

## 2016-07-16 DIAGNOSIS — H43813 Vitreous degeneration, bilateral: Secondary | ICD-10-CM | POA: Diagnosis not present

## 2016-07-16 DIAGNOSIS — I10 Essential (primary) hypertension: Secondary | ICD-10-CM

## 2016-07-16 DIAGNOSIS — H353132 Nonexudative age-related macular degeneration, bilateral, intermediate dry stage: Secondary | ICD-10-CM | POA: Diagnosis not present

## 2016-07-16 DIAGNOSIS — D3132 Benign neoplasm of left choroid: Secondary | ICD-10-CM

## 2016-07-17 ENCOUNTER — Telehealth: Payer: Self-pay | Admitting: Family Medicine

## 2016-07-17 NOTE — Telephone Encounter (Signed)
Re-mailed

## 2016-08-01 ENCOUNTER — Ambulatory Visit
Admission: RE | Admit: 2016-08-01 | Discharge: 2016-08-01 | Disposition: A | Discharge status: Home / Self Care | Payer: Medicare Other | Source: Ambulatory Visit | Attending: Surgery | Admitting: Surgery

## 2016-08-01 ENCOUNTER — Other Ambulatory Visit: Payer: Self-pay | Admitting: Surgery

## 2016-08-01 DIAGNOSIS — Z1231 Encounter for screening mammogram for malignant neoplasm of breast: Secondary | ICD-10-CM | POA: Insufficient documentation

## 2016-08-20 ENCOUNTER — Ambulatory Visit: Payer: Medicare Other | Admitting: Family Medicine

## 2016-09-24 ENCOUNTER — Ambulatory Visit (INDEPENDENT_AMBULATORY_CARE_PROVIDER_SITE_OTHER): Payer: Medicare Other | Admitting: Ophthalmology

## 2016-09-24 DIAGNOSIS — H35033 Hypertensive retinopathy, bilateral: Secondary | ICD-10-CM | POA: Diagnosis not present

## 2016-09-24 DIAGNOSIS — H35372 Puckering of macula, left eye: Secondary | ICD-10-CM

## 2016-09-24 DIAGNOSIS — H2511 Age-related nuclear cataract, right eye: Secondary | ICD-10-CM | POA: Diagnosis not present

## 2016-09-24 DIAGNOSIS — H353132 Nonexudative age-related macular degeneration, bilateral, intermediate dry stage: Secondary | ICD-10-CM | POA: Diagnosis not present

## 2016-09-24 DIAGNOSIS — D3132 Benign neoplasm of left choroid: Secondary | ICD-10-CM

## 2016-09-24 DIAGNOSIS — H43813 Vitreous degeneration, bilateral: Secondary | ICD-10-CM

## 2016-09-24 DIAGNOSIS — H59032 Cystoid macular edema following cataract surgery, left eye: Secondary | ICD-10-CM

## 2016-09-24 DIAGNOSIS — I10 Essential (primary) hypertension: Secondary | ICD-10-CM

## 2016-10-14 ENCOUNTER — Telehealth: Payer: Self-pay | Admitting: Family Medicine

## 2016-10-14 NOTE — Telephone Encounter (Signed)
I spoke with patient Please cancel her appt with me Wednesday Dec 6th and move it to Wednesday December 13th at 1:30 pm She is aware Thank you

## 2016-10-15 ENCOUNTER — Ambulatory Visit: Payer: Medicare Other | Admitting: Family Medicine

## 2016-10-15 NOTE — Telephone Encounter (Signed)
Done

## 2016-10-16 ENCOUNTER — Ambulatory Visit (INDEPENDENT_AMBULATORY_CARE_PROVIDER_SITE_OTHER): Payer: Medicare Other

## 2016-10-16 DIAGNOSIS — Z23 Encounter for immunization: Secondary | ICD-10-CM

## 2016-10-22 ENCOUNTER — Ambulatory Visit: Payer: Medicare Other | Admitting: Family Medicine

## 2016-11-24 ENCOUNTER — Encounter (INDEPENDENT_AMBULATORY_CARE_PROVIDER_SITE_OTHER): Payer: Medicare Other | Admitting: Ophthalmology

## 2016-11-24 DIAGNOSIS — H59032 Cystoid macular edema following cataract surgery, left eye: Secondary | ICD-10-CM

## 2016-11-24 DIAGNOSIS — I10 Essential (primary) hypertension: Secondary | ICD-10-CM | POA: Diagnosis not present

## 2016-11-24 DIAGNOSIS — H353132 Nonexudative age-related macular degeneration, bilateral, intermediate dry stage: Secondary | ICD-10-CM | POA: Diagnosis not present

## 2016-11-24 DIAGNOSIS — H2511 Age-related nuclear cataract, right eye: Secondary | ICD-10-CM

## 2016-11-24 DIAGNOSIS — H35033 Hypertensive retinopathy, bilateral: Secondary | ICD-10-CM

## 2016-11-24 DIAGNOSIS — D3132 Benign neoplasm of left choroid: Secondary | ICD-10-CM

## 2016-11-24 DIAGNOSIS — H43813 Vitreous degeneration, bilateral: Secondary | ICD-10-CM

## 2017-01-13 ENCOUNTER — Other Ambulatory Visit: Payer: Self-pay | Admitting: Family Medicine

## 2017-01-13 MED ORDER — OSELTAMIVIR PHOSPHATE 75 MG PO CAPS: 75.0000 mg | ORAL_CAPSULE | Freq: Every day | ORAL | 0 refills | Status: AC

## 2017-01-13 NOTE — Progress Notes (Signed)
Husband has flu; rx for prophylaxis

## 2017-01-22 ENCOUNTER — Encounter (INDEPENDENT_AMBULATORY_CARE_PROVIDER_SITE_OTHER): Payer: Medicare Other | Admitting: Ophthalmology

## 2017-01-22 DIAGNOSIS — H35033 Hypertensive retinopathy, bilateral: Secondary | ICD-10-CM | POA: Diagnosis not present

## 2017-01-22 DIAGNOSIS — D3132 Benign neoplasm of left choroid: Secondary | ICD-10-CM

## 2017-01-22 DIAGNOSIS — H43813 Vitreous degeneration, bilateral: Secondary | ICD-10-CM

## 2017-01-22 DIAGNOSIS — H59032 Cystoid macular edema following cataract surgery, left eye: Secondary | ICD-10-CM | POA: Diagnosis not present

## 2017-01-22 DIAGNOSIS — I10 Essential (primary) hypertension: Secondary | ICD-10-CM

## 2017-01-22 DIAGNOSIS — H35372 Puckering of macula, left eye: Secondary | ICD-10-CM

## 2017-01-22 DIAGNOSIS — H353132 Nonexudative age-related macular degeneration, bilateral, intermediate dry stage: Secondary | ICD-10-CM | POA: Diagnosis not present

## 2017-04-23 ENCOUNTER — Encounter (INDEPENDENT_AMBULATORY_CARE_PROVIDER_SITE_OTHER): Payer: Medicare Other | Admitting: Ophthalmology

## 2017-05-04 ENCOUNTER — Encounter (INDEPENDENT_AMBULATORY_CARE_PROVIDER_SITE_OTHER): Payer: Medicare Other | Admitting: Ophthalmology

## 2017-05-04 DIAGNOSIS — I10 Essential (primary) hypertension: Secondary | ICD-10-CM

## 2017-05-04 DIAGNOSIS — H35033 Hypertensive retinopathy, bilateral: Secondary | ICD-10-CM

## 2017-05-04 DIAGNOSIS — D3132 Benign neoplasm of left choroid: Secondary | ICD-10-CM

## 2017-05-04 DIAGNOSIS — H59032 Cystoid macular edema following cataract surgery, left eye: Secondary | ICD-10-CM

## 2017-05-04 DIAGNOSIS — H353132 Nonexudative age-related macular degeneration, bilateral, intermediate dry stage: Secondary | ICD-10-CM

## 2017-05-04 DIAGNOSIS — H35372 Puckering of macula, left eye: Secondary | ICD-10-CM

## 2017-05-04 DIAGNOSIS — H43813 Vitreous degeneration, bilateral: Secondary | ICD-10-CM

## 2017-06-19 ENCOUNTER — Other Ambulatory Visit: Payer: Self-pay | Admitting: Surgery

## 2017-06-19 DIAGNOSIS — Z1239 Encounter for other screening for malignant neoplasm of breast: Secondary | ICD-10-CM

## 2017-08-03 ENCOUNTER — Ambulatory Visit (INDEPENDENT_AMBULATORY_CARE_PROVIDER_SITE_OTHER): Payer: Medicare Other | Admitting: Family Medicine

## 2017-08-03 ENCOUNTER — Encounter: Payer: Self-pay | Admitting: Family Medicine

## 2017-08-03 VITALS — BP 134/68 | HR 80 | Temp 98.9°F | Resp 16 | Wt 131.0 lb

## 2017-08-03 DIAGNOSIS — H59032 Cystoid macular edema following cataract surgery, left eye: Secondary | ICD-10-CM | POA: Diagnosis not present

## 2017-08-03 DIAGNOSIS — L659 Nonscarring hair loss, unspecified: Secondary | ICD-10-CM | POA: Diagnosis not present

## 2017-08-03 DIAGNOSIS — R197 Diarrhea, unspecified: Secondary | ICD-10-CM

## 2017-08-03 DIAGNOSIS — M8580 Other specified disorders of bone density and structure, unspecified site: Secondary | ICD-10-CM

## 2017-08-03 DIAGNOSIS — H35372 Puckering of macula, left eye: Secondary | ICD-10-CM | POA: Diagnosis not present

## 2017-08-03 HISTORY — DX: Puckering of macula, left eye: H35.372

## 2017-08-03 HISTORY — DX: Cystoid macular edema following cataract surgery, left eye: H59.032

## 2017-08-03 NOTE — Assessment & Plan Note (Signed)
Follow-up with ophthalmologist

## 2017-08-03 NOTE — Patient Instructions (Addendum)
Let's get labs today If the loose stools continue, let me know and we'll get stool studies Practice good fall precautions  Fall Prevention in the Home Falls can cause injuries. They can happen to people of all ages. There are many things you can do to make your home safe and to help prevent falls. What can I do on the outside of my home?  Regularly fix the edges of walkways and driveways and fix any cracks.  Remove anything that might make you trip as you walk through a door, such as a raised step or threshold.  Trim any bushes or trees on the path to your home.  Use bright outdoor lighting.  Clear any walking paths of anything that might make someone trip, such as rocks or tools.  Regularly check to see if handrails are loose or broken. Make sure that both sides of any steps have handrails.  Any raised decks and porches should have guardrails on the edges.  Have any leaves, snow, or ice cleared regularly.  Use sand or salt on walking paths during winter.  Clean up any spills in your garage right away. This includes oil or grease spills. What can I do in the bathroom?  Use night lights.  Install grab bars by the toilet and in the tub and shower. Do not use towel bars as grab bars.  Use non-skid mats or decals in the tub or shower.  If you need to sit down in the shower, use a plastic, non-slip stool.  Keep the floor dry. Clean up any water that spills on the floor as soon as it happens.  Remove soap buildup in the tub or shower regularly.  Attach bath mats securely with double-sided non-slip rug tape.  Do not have throw rugs and other things on the floor that can make you trip. What can I do in the bedroom?  Use night lights.  Make sure that you have a light by your bed that is easy to reach.  Do not use any sheets or blankets that are too big for your bed. They should not hang down onto the floor.  Have a firm chair that has side arms. You can use this for support  while you get dressed.  Do not have throw rugs and other things on the floor that can make you trip. What can I do in the kitchen?  Clean up any spills right away.  Avoid walking on wet floors.  Keep items that you use a lot in easy-to-reach places.  If you need to reach something above you, use a strong step stool that has a grab bar.  Keep electrical cords out of the way.  Do not use floor polish or wax that makes floors slippery. If you must use wax, use non-skid floor wax.  Do not have throw rugs and other things on the floor that can make you trip. What can I do with my stairs?  Do not leave any items on the stairs.  Make sure that there are handrails on both sides of the stairs and use them. Fix handrails that are broken or loose. Make sure that handrails are as long as the stairways.  Check any carpeting to make sure that it is firmly attached to the stairs. Fix any carpet that is loose or worn.  Avoid having throw rugs at the top or bottom of the stairs. If you do have throw rugs, attach them to the floor with carpet tape.  Make  sure that you have a light switch at the top of the stairs and the bottom of the stairs. If you do not have them, ask someone to add them for you. What else can I do to help prevent falls?  Wear shoes that: ? Do not have high heels. ? Have rubber bottoms. ? Are comfortable and fit you well. ? Are closed at the toe. Do not wear sandals.  If you use a stepladder: ? Make sure that it is fully opened. Do not climb a closed stepladder. ? Make sure that both sides of the stepladder are locked into place. ? Ask someone to hold it for you, if possible.  Clearly mark and make sure that you can see: ? Any grab bars or handrails. ? First and last steps. ? Where the edge of each step is.  Use tools that help you move around (mobility aids) if they are needed. These include: ? Canes. ? Walkers. ? Scooters. ? Crutches.  Turn on the lights when  you go into a dark area. Replace any light bulbs as soon as they burn out.  Set up your furniture so you have a clear path. Avoid moving your furniture around.  If any of your floors are uneven, fix them.  If there are any pets around you, be aware of where they are.  Review your medicines with your doctor. Some medicines can make you feel dizzy. This can increase your chance of falling. Ask your doctor what other things that you can do to help prevent falls. This information is not intended to replace advice given to you by your health care provider. Make sure you discuss any questions you have with your health care provider. Document Released: 08/23/2009 Document Revised: 04/03/2016 Document Reviewed: 12/01/2014 Elsevier Interactive Patient Education  [employer].

## 2017-08-03 NOTE — Progress Notes (Signed)
BP 134/68 (BP Location: Right Arm, Patient Position: Sitting, Cuff Size: Normal)   Pulse 80   Temp 98.9 F (37.2 C) (Oral)   Resp 16   Wt 131 lb (59.4 kg)   SpO2 97%   BMI 21.14 kg/m    Subjective:    Patient ID: Kayla Mayer, female    DOB: 1930-05-25, 83 y.o.   MRN: [id number]  HPI: Kayla Mayer is a 83 y.o. female  Chief Complaint  Patient presents with  . Follow-up    HPI Patient is here for f/u She has high blood pressure; controlled today on no medicines for over a year; limiting salt, watching her diet Weight is down, but not suspicious; her hair is falling out; taking supplement for hair and skin and nails; whole hair; no patches of baldness seen; she had some diarrhea until last night; stools will sometimes float, that's new; no blood, no mucous noted but she will look; no fevers; no hx of diverticulitis; little bit of abd discomfort on the left side, hardly noteworthy, very minor; some gas; city water She has osteopenia; last DEXA was 01/25/2015; she does not want to get another DEXA right now Would like sugar checked; no first degree relatives; craving chocolate  Depression screen [facility] 2/9 08/03/2017 07/02/2016  Decreased Interest 0 0  Down, Depressed, Hopeless 0 0  PHQ - 2 Score 0 0   Relevant past medical, surgical, family and social history reviewed Past Medical History:  Diagnosis Date  . Cancer ([place] skin  . Colonic polyp   . Cystoid macular edema following cataract surgery, left eye 08/03/2017  . Heart murmur   . Hypertension   . Osteopenia   . 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 Stomach problems    Past Surgical History:  Procedure Laterality Date  . ABDOMINAL HYSTERECTOMY    . BREAST BIOPSY Left    neg  . BREAST BIOPSY Left    neg  . BREAST SURGERY    . SKIN SURGERY  Nov 2016   basal cell removed from lip  . TONSILLECTOMY     Family History  Problem Relation Age of Onset  . Cancer Mother        ovarian  . Cancer Father    pancreatic  . Breast cancer Sister 73  . Cancer Sister        cervical,colon, breast  . Hyperlipidemia Sister   . Hypertension Sister   . Thyroid disease Sister   . Stroke Sister   . Cancer Brother        espohageal  . Cancer Brother        colon   Social History   Social History  . Marital status: Married    Spouse name: N/A  . Number of children: N/A  . Years of education: N/A   Occupational History  . Not on file.   Social History Main Topics  . Smoking status: Former Smoker    Packs/day: 2.00    Years: 15.00  . Smokeless tobacco: Never Used  . Alcohol use No  . Drug use: No  . Sexual activity: Not Currently   Other Topics Concern  . Not on file   Social History Narrative  . No narrative on file   Interim medical history since last visit reviewed. Allergies and medications reviewed  Review of Systems Per HPI unless specifically indicated above     Objective:    BP 134/68 (BP Location: Right Arm, Patient Position: Sitting,  Cuff Size: Normal)   Pulse 80   Temp 98.9 F (37.2 C) (Oral)   Resp 16   Wt 131 lb (59.4 kg)   SpO2 97%   BMI 21.14 kg/m   Wt Readings from Last 3 Encounters:  08/03/17 131 lb (59.4 kg)  07/02/16 134 lb 4.8 oz (60.9 kg)  11/16/15 139 lb (63 kg)    Physical Exam  Constitutional: She appears well-developed and well-nourished. No distress.  HENT:  Mouth/Throat: Oropharynx is clear and moist and mucous membranes are normal. Mucous membranes are not dry.  Eyes: No scleral icterus.  Neck: No JVD present. Carotid bruit is not present.  Cardiovascular: Normal rate and regular rhythm.   Pulmonary/Chest: Effort normal and breath sounds normal. No respiratory distress. She has no wheezes. She has no rales.  Abdominal: She exhibits no distension.  Musculoskeletal: She exhibits no edema.  Skin: Skin is warm, dry and intact. No rash noted. No erythema. No pallor.  Psychiatric: Her speech is normal and behavior is normal. Her mood appears  not anxious. Cognition and memory are not impaired. She does not exhibit a depressed mood.   Results for orders placed or performed in visit on 07/02/16  CBC with Differential/Platelet  Result Value Ref Range   WBC 6.6 3.4 - 10.8 x10E3/uL   RBC 3.93 3.77 - 5.28 x10E6/uL   Hemoglobin 12.1 11.1 - 15.9 g/dL   Hematocrit 36.7 34.0 - 46.6 %   MCV 93 79 - 97 fL   MCH 30.8 26.6 - 33.0 pg   MCHC 33.0 31.5 - 35.7 g/dL   RDW 14.8 12.3 - 15.4 %   Platelets 199 150 - 379 x10E3/uL   Neutrophils 52 %   Lymphs 38 %   Monocytes 7 %   Eos 2 %   Basos 1 %   Neutrophils Absolute 3.5 1.4 - 7.0 x10E3/uL   Lymphocytes Absolute 2.5 0.7 - 3.1 x10E3/uL   Monocytes Absolute 0.5 0.1 - 0.9 x10E3/uL   EOS (ABSOLUTE) 0.1 0.0 - 0.4 x10E3/uL   Basophils Absolute 0.1 0.0 - 0.2 x10E3/uL   Immature Granulocytes 0 %   Immature Grans (Abs) 0.0 0.0 - 0.1 x10E3/uL  Comprehensive metabolic panel  Result Value Ref Range   Glucose 101 (H) 65 - 99 mg/dL   BUN 19 8 - 27 mg/dL   Creatinine, Ser 0.74 0.57 - 1.00 mg/dL   GFR calc non Af Amer 75 >59 mL/min/1.73   GFR calc Af Amer 86 >59 mL/min/1.73   BUN/Creatinine Ratio 26 12 - 28   Sodium 143 134 - 144 mmol/L   Potassium 4.1 3.5 - 5.2 mmol/L   Chloride 101 96 - 106 mmol/L   CO2 29 18 - 29 mmol/L   Calcium 9.7 8.7 - 10.3 mg/dL   Total Protein 6.3 6.0 - 8.5 g/dL   Albumin 3.8 3.5 - 4.7 g/dL   Globulin, Total 2.5 1.5 - 4.5 g/dL   Albumin/Globulin Ratio 1.5 1.2 - 2.2   Bilirubin Total 0.3 0.0 - 1.2 mg/dL   Alkaline Phosphatase 51 39 - 117 IU/L   AST 24 0 - 40 IU/L   ALT 18 0 - 32 IU/L  Lipid Panel w/o Chol/HDL Ratio  Result Value Ref Range   Cholesterol, Total 238 (H) 100 - 199 mg/dL   Triglycerides 137 0 - 149 mg/dL   HDL 95 >39 mg/dL   VLDL Cholesterol Cal 27 5 - 40 mg/dL   LDL Calculated 116 (H) 0 - 99 mg/dL  TSH  Result Value Ref Range   TSH 1.540 0.450 - 4.500 uIU/mL      Assessment & Plan:   Problem List Items Addressed This Visit       Musculoskeletal and Integument   Osteopenia    Check vit D; 3 servings of calcium a day; 1000 iu vitamin D3 daily      Relevant Orders   VITAMIN D 25 Hydroxy (Vit-D Deficiency, Fractures)     Other   Preretinal fibrosis, left eye    Follow-up with ophthalmologist      Cystoid macular edema following cataract surgery, left eye    Follow-up with ophthalmologist       Other Visit Diagnoses    Hair loss    -  Primary   may be thyroid-related, or telogen effluvium; labs ordered   Relevant Orders   TSH   T4, free   Diarrhea, unspecified type       check thyroid labs to make sure not hyperthyroid; if persistent, will get additional labs/stool studies   Relevant Orders   Comprehensive Metabolic Panel (CMET)   CBC with Differential/Platelet       Follow up plan: Return in about 1 year (around 08/03/2018) for follow-up visit with Dr. Sanda Klein, Medicare Wellness visit when desired (once a year).  An after-visit summary was printed and given to the patient at [place].  Please see the patient instructions which may contain other information and recommendations beyond what is mentioned above in the assessment and plan.  Meds ordered this encounter  Medications  . B Complex Vitamins (VITAMIN-B COMPLEX) TABS    Sig: Take 1 tablet by mouth daily.  Marland Kitchen BROMSITE 0.075 % SOLN    Sig: Place 1 drop into the left eye 2 (two) times daily.  . Omega-3 Krill Oil 500 MG CAPS    Sig: Take 1 tablet by mouth daily.  Marland Kitchen PHOSPHATIDYLSERINE PO    Sig: Take 500 mg by mouth daily.    Orders Placed This Encounter  Procedures  . TSH  . T4, free  . Comprehensive Metabolic Panel (CMET)  . CBC with Differential/Platelet  . VITAMIN D 25 Hydroxy (Vit-D Deficiency, Fractures)

## 2017-08-03 NOTE — Assessment & Plan Note (Signed)
Check vit D; 3 servings of calcium a day; 1000 iu vitamin D3 daily

## 2017-08-04 ENCOUNTER — Encounter (INDEPENDENT_AMBULATORY_CARE_PROVIDER_SITE_OTHER): Payer: Medicare Other | Admitting: Ophthalmology

## 2017-08-04 DIAGNOSIS — H35033 Hypertensive retinopathy, bilateral: Secondary | ICD-10-CM | POA: Diagnosis not present

## 2017-08-04 DIAGNOSIS — H43813 Vitreous degeneration, bilateral: Secondary | ICD-10-CM | POA: Diagnosis not present

## 2017-08-04 DIAGNOSIS — I10 Essential (primary) hypertension: Secondary | ICD-10-CM | POA: Diagnosis not present

## 2017-08-04 DIAGNOSIS — D3132 Benign neoplasm of left choroid: Secondary | ICD-10-CM | POA: Diagnosis not present

## 2017-08-04 DIAGNOSIS — H318 Other specified disorders of choroid: Secondary | ICD-10-CM

## 2017-08-04 DIAGNOSIS — H59032 Cystoid macular edema following cataract surgery, left eye: Secondary | ICD-10-CM | POA: Diagnosis not present

## 2017-08-04 DIAGNOSIS — H353132 Nonexudative age-related macular degeneration, bilateral, intermediate dry stage: Secondary | ICD-10-CM | POA: Diagnosis not present

## 2017-08-04 DIAGNOSIS — H2511 Age-related nuclear cataract, right eye: Secondary | ICD-10-CM

## 2017-08-04 LAB — COMPREHENSIVE METABOLIC PANEL
AG RATIO: 1.6 (calc) (ref 1.0–2.5)
ALKALINE PHOSPHATASE (APISO): 54 U/L (ref 33–130)
ALT: 14 U/L (ref 6–29)
AST: 21 U/L (ref 10–35)
Albumin: 3.9 g/dL (ref 3.6–5.1)
BUN: 13 mg/dL (ref 7–25)
CHLORIDE: 104 mmol/L (ref 98–110)
CO2: 30 mmol/L (ref 20–32)
CREATININE: 0.61 mg/dL (ref 0.60–0.88)
Calcium: 9.8 mg/dL (ref 8.6–10.4)
GLOBULIN: 2.5 g/dL (ref 1.9–3.7)
GLUCOSE: 90 mg/dL (ref 65–99)
Potassium: 4.8 mmol/L (ref 3.5–5.3)
Sodium: 142 mmol/L (ref 135–146)
Total Bilirubin: 0.7 mg/dL (ref 0.2–1.2)
Total Protein: 6.4 g/dL (ref 6.1–8.1)

## 2017-08-04 LAB — CBC WITH DIFFERENTIAL/PLATELET
BASOS PCT: 0.6 %
Basophils Absolute: 41 cells/uL (ref 0–200)
EOS PCT: 1.3 %
Eosinophils Absolute: 90 cells/uL (ref 15–500)
HCT: 39.3 % (ref 35.0–45.0)
Hemoglobin: 13 g/dL (ref 11.7–15.5)
Lymphs Abs: 2505 cells/uL (ref 850–3900)
MCH: 31 pg (ref 27.0–33.0)
MCHC: 33.1 g/dL (ref 32.0–36.0)
MCV: 93.8 fL (ref 80.0–100.0)
MPV: 11.9 fL (ref 7.5–12.5)
Monocytes Relative: 4.9 %
NEUTROS PCT: 56.9 %
Neutro Abs: 3926 cells/uL (ref 1500–7800)
PLATELETS: 196 10*3/uL (ref 140–400)
RBC: 4.19 10*6/uL (ref 3.80–5.10)
RDW: 12.8 % (ref 11.0–15.0)
TOTAL LYMPHOCYTE: 36.3 %
WBC: 6.9 10*3/uL (ref 3.8–10.8)
WBCMIX: 338 {cells}/uL (ref 200–950)

## 2017-08-04 LAB — VITAMIN D 25 HYDROXY (VIT D DEFICIENCY, FRACTURES): VIT D 25 HYDROXY: 45 ng/mL (ref 30–100)

## 2017-08-04 LAB — TSH: TSH: 1.42 mIU/L (ref 0.40–4.50)

## 2017-08-04 LAB — T4, FREE: FREE T4: 1.1 ng/dL (ref 0.8–1.8)

## 2017-08-05 ENCOUNTER — Other Ambulatory Visit: Payer: Self-pay | Admitting: Surgery

## 2017-08-05 ENCOUNTER — Ambulatory Visit
Admission: RE | Admit: 2017-08-05 | Discharge: 2017-08-05 | Disposition: A | Discharge status: Home / Self Care | Payer: Medicare Other | Source: Ambulatory Visit | Attending: Surgery | Admitting: Surgery

## 2017-08-05 DIAGNOSIS — Z1239 Encounter for other screening for malignant neoplasm of breast: Secondary | ICD-10-CM

## 2017-08-05 DIAGNOSIS — Z1231 Encounter for screening mammogram for malignant neoplasm of breast: Secondary | ICD-10-CM | POA: Insufficient documentation

## 2017-09-07 ENCOUNTER — Encounter (INDEPENDENT_AMBULATORY_CARE_PROVIDER_SITE_OTHER): Payer: Medicare Other | Admitting: Ophthalmology

## 2017-09-07 DIAGNOSIS — H318 Other specified disorders of choroid: Secondary | ICD-10-CM

## 2017-09-07 DIAGNOSIS — H353221 Exudative age-related macular degeneration, left eye, with active choroidal neovascularization: Secondary | ICD-10-CM

## 2017-09-14 ENCOUNTER — Ambulatory Visit (INDEPENDENT_AMBULATORY_CARE_PROVIDER_SITE_OTHER): Payer: Medicare Other

## 2017-09-14 DIAGNOSIS — Z23 Encounter for immunization: Secondary | ICD-10-CM | POA: Diagnosis not present

## 2017-12-08 ENCOUNTER — Encounter (INDEPENDENT_AMBULATORY_CARE_PROVIDER_SITE_OTHER): Payer: Medicare Other | Admitting: Ophthalmology

## 2017-12-08 DIAGNOSIS — H2511 Age-related nuclear cataract, right eye: Secondary | ICD-10-CM | POA: Diagnosis not present

## 2017-12-08 DIAGNOSIS — H353132 Nonexudative age-related macular degeneration, bilateral, intermediate dry stage: Secondary | ICD-10-CM

## 2017-12-08 DIAGNOSIS — H43813 Vitreous degeneration, bilateral: Secondary | ICD-10-CM | POA: Diagnosis not present

## 2017-12-08 DIAGNOSIS — H318 Other specified disorders of choroid: Secondary | ICD-10-CM | POA: Diagnosis not present

## 2017-12-08 DIAGNOSIS — H59032 Cystoid macular edema following cataract surgery, left eye: Secondary | ICD-10-CM

## 2017-12-08 DIAGNOSIS — H35033 Hypertensive retinopathy, bilateral: Secondary | ICD-10-CM | POA: Diagnosis not present

## 2017-12-08 DIAGNOSIS — D3132 Benign neoplasm of left choroid: Secondary | ICD-10-CM

## 2017-12-08 DIAGNOSIS — I10 Essential (primary) hypertension: Secondary | ICD-10-CM

## 2017-12-08 DIAGNOSIS — H35372 Puckering of macula, left eye: Secondary | ICD-10-CM

## 2018-04-08 ENCOUNTER — Encounter (INDEPENDENT_AMBULATORY_CARE_PROVIDER_SITE_OTHER): Payer: Medicare Other | Admitting: Ophthalmology

## 2018-04-08 DIAGNOSIS — H59032 Cystoid macular edema following cataract surgery, left eye: Secondary | ICD-10-CM

## 2018-04-08 DIAGNOSIS — H35372 Puckering of macula, left eye: Secondary | ICD-10-CM | POA: Diagnosis not present

## 2018-04-08 DIAGNOSIS — H35033 Hypertensive retinopathy, bilateral: Secondary | ICD-10-CM | POA: Diagnosis not present

## 2018-04-08 DIAGNOSIS — H43813 Vitreous degeneration, bilateral: Secondary | ICD-10-CM | POA: Diagnosis not present

## 2018-04-08 DIAGNOSIS — H353132 Nonexudative age-related macular degeneration, bilateral, intermediate dry stage: Secondary | ICD-10-CM | POA: Diagnosis not present

## 2018-04-08 DIAGNOSIS — I10 Essential (primary) hypertension: Secondary | ICD-10-CM

## 2018-04-08 DIAGNOSIS — D3132 Benign neoplasm of left choroid: Secondary | ICD-10-CM

## 2018-06-22 ENCOUNTER — Other Ambulatory Visit: Payer: Self-pay | Admitting: General Surgery

## 2018-06-22 DIAGNOSIS — Z1231 Encounter for screening mammogram for malignant neoplasm of breast: Secondary | ICD-10-CM

## 2018-08-04 ENCOUNTER — Encounter: Payer: Self-pay | Admitting: Family Medicine

## 2018-08-04 ENCOUNTER — Ambulatory Visit: Payer: Medicare Other | Admitting: Family Medicine

## 2018-08-04 VITALS — BP 138/78 | HR 69 | Temp 98.4°F | Ht 65.0 in | Wt 129.2 lb

## 2018-08-04 DIAGNOSIS — E7800 Pure hypercholesterolemia, unspecified: Secondary | ICD-10-CM | POA: Diagnosis not present

## 2018-08-04 DIAGNOSIS — M7918 Myalgia, other site: Secondary | ICD-10-CM

## 2018-08-04 DIAGNOSIS — L659 Nonscarring hair loss, unspecified: Secondary | ICD-10-CM | POA: Diagnosis not present

## 2018-08-04 DIAGNOSIS — Z5181 Encounter for therapeutic drug level monitoring: Secondary | ICD-10-CM | POA: Diagnosis not present

## 2018-08-04 DIAGNOSIS — Z23 Encounter for immunization: Secondary | ICD-10-CM | POA: Diagnosis not present

## 2018-08-04 LAB — COMPLETE METABOLIC PANEL WITH GFR
AG Ratio: 1.5 (calc) (ref 1.0–2.5)
ALKALINE PHOSPHATASE (APISO): 55 U/L (ref 33–130)
ALT: 14 U/L (ref 6–29)
AST: 25 U/L (ref 10–35)
Albumin: 4.1 g/dL (ref 3.6–5.1)
BUN: 17 mg/dL (ref 7–25)
CALCIUM: 10.4 mg/dL (ref 8.6–10.4)
CO2: 34 mmol/L — AB (ref 20–32)
CREATININE: 0.75 mg/dL (ref 0.60–0.88)
Chloride: 102 mmol/L (ref 98–110)
GFR, Est African American: 84 mL/min/{1.73_m2} (ref >=60)
GFR, Est Non African American: 72 mL/min/{1.73_m2} (ref >=60)
GLOBULIN: 2.7 g/dL (ref 1.9–3.7)
GLUCOSE: 90 mg/dL (ref 65–139)
Potassium: 5.1 mmol/L (ref 3.5–5.3)
Sodium: 142 mmol/L (ref 135–146)
Total Bilirubin: 0.6 mg/dL (ref 0.2–1.2)
Total Protein: 6.8 g/dL (ref 6.1–8.1)

## 2018-08-04 LAB — CBC WITH DIFFERENTIAL/PLATELET
Basophils Absolute: 54 cells/uL (ref 0–200)
Basophils Relative: 0.8 %
EOS PCT: 0.9 %
Eosinophils Absolute: 60 cells/uL (ref 15–500)
HEMATOCRIT: 38.7 % (ref 35.0–45.0)
Hemoglobin: 13.1 g/dL (ref 11.7–15.5)
LYMPHS ABS: 2265 {cells}/uL (ref 850–3900)
MCH: 31.9 pg (ref 27.0–33.0)
MCHC: 33.9 g/dL (ref 32.0–36.0)
MCV: 94.2 fL (ref 80.0–100.0)
MONOS PCT: 6.8 %
MPV: 11.8 fL (ref 7.5–12.5)
NEUTROS PCT: 57.7 %
Neutro Abs: 3866 cells/uL (ref 1500–7800)
PLATELETS: 215 10*3/uL (ref 140–400)
RBC: 4.11 10*6/uL (ref 3.80–5.10)
RDW: 12.7 % (ref 11.0–15.0)
Total Lymphocyte: 33.8 %
WBC mixed population: 456 cells/uL (ref 200–950)
WBC: 6.7 10*3/uL (ref 3.8–10.8)

## 2018-08-04 LAB — LIPID PANEL
CHOLESTEROL: 268 mg/dL — AB (ref <200)
HDL: 99 mg/dL (ref >50)
LDL CHOLESTEROL (CALC): 147 mg/dL — AB
Non-HDL Cholesterol (Calc): 169 mg/dL (calc) — ABNORMAL HIGH (ref <130)
TRIGLYCERIDES: 108 mg/dL (ref <150)
Total CHOL/HDL Ratio: 2.7 (calc) (ref <5.0)

## 2018-08-04 LAB — TSH: TSH: 1.19 mIU/L (ref 0.40–4.50)

## 2018-08-04 NOTE — Patient Instructions (Addendum)
Check your multivitamins at home Consider getting rid of everything but the AREDS formula and you add biotin to that for hair, skin, and nails I'll be glad to refer you to a dermatologist if desired, just let me know Let's get labs today You have received the Prevnar vaccine (PCV-13) and you will not need another booster of this for the rest of your life per current ACIP guidelines

## 2018-08-04 NOTE — Progress Notes (Signed)
BP 138/78   Pulse 69   Temp 98.4 F (36.9 C) (Oral)   Ht 5\' 5"  (1.651 m)   Wt 129 lb 3.2 oz (58.6 kg)   SpO2 96%   BMI 21.50 kg/m    Subjective:    Patient ID: Kayla Mayer, female    DOB: 05/05/1930, 84 y.o.   MRN: [id number]  HPI: Kayla Mayer is a 84 y.o. female  Chief Complaint  Patient presents with  . Follow-up    HPI Patient is here for f/u HTN; controlled on the recheck; she checks her BP at home; range is 138/75 She is thinking about pneumonia vaccine; thinks she had the old PPSV-23 shot; not the 13 Hx of pneumonia in her 72s No medical excitement; she will wait a little longer to get her flu shot Tetanus coming due in 2020, patient aware to go to pharmacy if injured Taking hair and nail vitamin; losing hair all throughout scalp and over the body She is having some soreness in the muscle behind right shoulder blade; tender to touch; no rash  Depression screen [facility] 2/9 08/04/2018 08/03/2017 07/02/2016  Decreased Interest 0 0 0  Down, Depressed, Hopeless 0 0 0  PHQ - 2 Score 0 0 0  Altered sleeping 0 - -  Tired, decreased energy 0 - -  Change in appetite 0 - -  Feeling bad or failure about yourself  0 - -  Trouble concentrating 0 - -  Moving slowly or fidgety/restless 0 - -  Suicidal thoughts 0 - -  PHQ-9 Score 0 - -  Difficult doing work/chores Not difficult at all - -   Fall Risk  08/04/2018 08/03/2017 07/02/2016  Falls in the past year? No No No    Relevant past medical, surgical, family and social history reviewed Past Medical History:  Diagnosis Date  . Cancer ([place]) skin  . Colonic polyp   . Cystoid macular edema following cataract surgery, left eye 08/03/2017  . Heart murmur   . Hypertension   . Osteopenia   . 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 Stomach problems    Past Surgical History:  Procedure Laterality Date  . ABDOMINAL HYSTERECTOMY    . BREAST BIOPSY Left    neg  . BREAST BIOPSY Left    neg  . BREAST SURGERY    . SKIN  SURGERY  Nov 2016   basal cell removed from lip  . TONSILLECTOMY     Family History  Problem Relation Age of Onset  . Cancer Mother        ovarian  . Cancer Father        pancreatic  . Breast cancer Sister 90  . Cancer Sister        cervical,colon, breast  . Hyperlipidemia Sister   . Hypertension Sister   . Thyroid disease Sister   . Stroke Sister   . Cancer Brother        espohageal  . Cancer Brother        colon   Social History   Tobacco Use  . Smoking status: Former Smoker    Packs/day: 2.00    Years: 15.00    Pack years: 30.00  . Smokeless tobacco: Never Used  Substance Use Topics  . Alcohol use: No  . Drug use: No     Office Visit from 08/04/2018 in [facility]  AUDIT-C Score  0      Interim medical history since last visit  reviewed. Allergies and medications reviewed  Review of Systems Per HPI unless specifically indicated above     Objective:    BP 138/78   Pulse 69   Temp 98.4 F (36.9 C) (Oral)   Ht 5\' 5"  (1.651 m)   Wt 129 lb 3.2 oz (58.6 kg)   SpO2 96%   BMI 21.50 kg/m   Wt Readings from Last 3 Encounters:  08/04/18 129 lb 3.2 oz (58.6 kg)  08/03/17 131 lb (59.4 kg)  07/02/16 134 lb 4.8 oz (60.9 kg)    Physical Exam  Constitutional: She appears well-developed and well-nourished.  HENT:  Mouth/Throat: Mucous membranes are normal.  Eyes: EOM are normal. No scleral icterus.  Cardiovascular: Normal rate and regular rhythm.  Pulmonary/Chest: Effort normal and breath sounds normal.  Musculoskeletal:       Thoracic back: She exhibits tenderness. She exhibits normal range of motion and no deformity.       Back:  Skin:  Scalp appears healthy; no areas of alopecia  Psychiatric: She has a normal mood and affect. Her behavior is normal.      Assessment & Plan:   Problem List Items Addressed This Visit      Other   Medication monitoring encounter   Relevant Orders   CBC with Differential/Platelet (Completed)    COMPLETE METABOLIC PANEL WITH GFR (Completed)    Other Visit Diagnoses    High cholesterol    -  Primary   check labs today   Relevant Orders   Lipid panel (Completed)   Hair loss       no alopecia seen; will check labs; may continue biotin   Relevant Orders   TSH (Completed)   Need for vaccination with 13-polyvalent pneumococcal conjugate vaccine       offered, given today   Relevant Orders   Pneumococcal conjugate vaccine 13-valent IM (Completed)   Need for influenza vaccination       Relevant Orders   Flu vaccine HIGH DOSE PF (Fluzone High dose) (Completed)   Rhomboid muscle pain       suspect musculoskel etiology; not c/w aneurysm, pneumonia, etc.; conserative measures       Follow up plan: Return in about 1 year (around 08/05/2019) for follow-up visit with Dr. Sanda Klein.  An after-visit summary was printed and given to the patient at [place].  Please see the patient instructions which may contain other information and recommendations beyond what is mentioned above in the assessment and plan.  No orders of the defined types were placed in this encounter.   Orders Placed This Encounter  Procedures  . Pneumococcal conjugate vaccine 13-valent IM  . Flu vaccine HIGH DOSE PF (Fluzone High dose)  . CBC with Differential/Platelet  . COMPLETE METABOLIC PANEL WITH GFR  . Lipid panel  . TSH

## 2018-08-09 ENCOUNTER — Encounter (INDEPENDENT_AMBULATORY_CARE_PROVIDER_SITE_OTHER): Payer: Medicare Other | Admitting: Ophthalmology

## 2018-08-09 DIAGNOSIS — H59032 Cystoid macular edema following cataract surgery, left eye: Secondary | ICD-10-CM

## 2018-08-09 DIAGNOSIS — H43813 Vitreous degeneration, bilateral: Secondary | ICD-10-CM

## 2018-08-09 DIAGNOSIS — H35033 Hypertensive retinopathy, bilateral: Secondary | ICD-10-CM

## 2018-08-09 DIAGNOSIS — H353132 Nonexudative age-related macular degeneration, bilateral, intermediate dry stage: Secondary | ICD-10-CM | POA: Diagnosis not present

## 2018-08-09 DIAGNOSIS — I10 Essential (primary) hypertension: Secondary | ICD-10-CM | POA: Diagnosis not present

## 2018-08-09 DIAGNOSIS — H35372 Puckering of macula, left eye: Secondary | ICD-10-CM | POA: Diagnosis not present

## 2018-08-09 DIAGNOSIS — D3132 Benign neoplasm of left choroid: Secondary | ICD-10-CM

## 2018-08-19 ENCOUNTER — Ambulatory Visit
Admission: RE | Admit: 2018-08-19 | Discharge: 2018-08-19 | Disposition: A | Discharge status: Home / Self Care | Payer: Medicare Other | Source: Ambulatory Visit | Attending: General Surgery | Admitting: General Surgery

## 2018-08-19 DIAGNOSIS — Z1231 Encounter for screening mammogram for malignant neoplasm of breast: Secondary | ICD-10-CM

## 2018-12-09 ENCOUNTER — Encounter (INDEPENDENT_AMBULATORY_CARE_PROVIDER_SITE_OTHER): Payer: Medicare Other | Admitting: Ophthalmology

## 2018-12-09 DIAGNOSIS — H59031 Cystoid macular edema following cataract surgery, right eye: Secondary | ICD-10-CM | POA: Diagnosis not present

## 2018-12-09 DIAGNOSIS — I10 Essential (primary) hypertension: Secondary | ICD-10-CM

## 2018-12-09 DIAGNOSIS — D3132 Benign neoplasm of left choroid: Secondary | ICD-10-CM

## 2018-12-09 DIAGNOSIS — H43813 Vitreous degeneration, bilateral: Secondary | ICD-10-CM

## 2018-12-09 DIAGNOSIS — H35033 Hypertensive retinopathy, bilateral: Secondary | ICD-10-CM

## 2018-12-09 DIAGNOSIS — H353132 Nonexudative age-related macular degeneration, bilateral, intermediate dry stage: Secondary | ICD-10-CM

## 2018-12-09 DIAGNOSIS — H35372 Puckering of macula, left eye: Secondary | ICD-10-CM

## 2018-12-09 DIAGNOSIS — H318 Other specified disorders of choroid: Secondary | ICD-10-CM

## 2018-12-15 ENCOUNTER — Telehealth: Payer: Self-pay | Admitting: Family Medicine

## 2018-12-15 NOTE — Telephone Encounter (Signed)
I left a message asking the patient to call and schedule Medicare AWV-I with Nurse Health Advisor, Kasey.  If the patient calls back, please schedule Medicare Wellness Visit with Nurse Health Advisor. VDM (DD) °

## 2018-12-16 NOTE — Telephone Encounter (Signed)
Pt returned call. Pt says that she would like to decline having visit at this time.

## 2019-01-26 ENCOUNTER — Ambulatory Visit: Payer: Medicare Other | Admitting: Family Medicine

## 2019-01-27 ENCOUNTER — Ambulatory Visit: Payer: Medicare Other | Admitting: Family Medicine

## 2019-02-03 ENCOUNTER — Ambulatory Visit: Payer: Medicare Other | Admitting: Family Medicine

## 2019-03-03 ENCOUNTER — Encounter: Payer: Self-pay | Admitting: Family Medicine

## 2019-04-11 ENCOUNTER — Encounter (INDEPENDENT_AMBULATORY_CARE_PROVIDER_SITE_OTHER): Payer: Medicare Other | Admitting: Ophthalmology

## 2019-04-11 ENCOUNTER — Other Ambulatory Visit: Payer: Self-pay

## 2019-04-11 DIAGNOSIS — H35372 Puckering of macula, left eye: Secondary | ICD-10-CM

## 2019-04-11 DIAGNOSIS — I10 Essential (primary) hypertension: Secondary | ICD-10-CM | POA: Diagnosis not present

## 2019-04-11 DIAGNOSIS — H353132 Nonexudative age-related macular degeneration, bilateral, intermediate dry stage: Secondary | ICD-10-CM

## 2019-04-11 DIAGNOSIS — H43813 Vitreous degeneration, bilateral: Secondary | ICD-10-CM

## 2019-04-11 DIAGNOSIS — H59032 Cystoid macular edema following cataract surgery, left eye: Secondary | ICD-10-CM | POA: Diagnosis not present

## 2019-04-11 DIAGNOSIS — H35033 Hypertensive retinopathy, bilateral: Secondary | ICD-10-CM | POA: Diagnosis not present

## 2019-04-11 DIAGNOSIS — D3132 Benign neoplasm of left choroid: Secondary | ICD-10-CM

## 2019-04-11 DIAGNOSIS — H2511 Age-related nuclear cataract, right eye: Secondary | ICD-10-CM

## 2019-08-15 ENCOUNTER — Encounter (INDEPENDENT_AMBULATORY_CARE_PROVIDER_SITE_OTHER): Payer: Medicare Other | Admitting: Ophthalmology

## 2019-08-15 ENCOUNTER — Other Ambulatory Visit: Payer: Self-pay

## 2019-08-15 DIAGNOSIS — H59032 Cystoid macular edema following cataract surgery, left eye: Secondary | ICD-10-CM | POA: Diagnosis not present

## 2019-08-15 DIAGNOSIS — H35033 Hypertensive retinopathy, bilateral: Secondary | ICD-10-CM

## 2019-08-15 DIAGNOSIS — I10 Essential (primary) hypertension: Secondary | ICD-10-CM

## 2019-08-15 DIAGNOSIS — D3132 Benign neoplasm of left choroid: Secondary | ICD-10-CM

## 2019-08-15 DIAGNOSIS — H2511 Age-related nuclear cataract, right eye: Secondary | ICD-10-CM

## 2019-08-15 DIAGNOSIS — H35372 Puckering of macula, left eye: Secondary | ICD-10-CM

## 2019-08-15 DIAGNOSIS — H43813 Vitreous degeneration, bilateral: Secondary | ICD-10-CM

## 2019-08-15 DIAGNOSIS — H353132 Nonexudative age-related macular degeneration, bilateral, intermediate dry stage: Secondary | ICD-10-CM | POA: Diagnosis not present

## 2019-08-16 ENCOUNTER — Other Ambulatory Visit: Payer: Self-pay | Admitting: General Surgery

## 2019-08-16 DIAGNOSIS — Z1231 Encounter for screening mammogram for malignant neoplasm of breast: Secondary | ICD-10-CM

## 2019-08-18 IMAGING — MG MM DIGITAL SCREENING BILAT W/ TOMO W/ CAD
9 of 12 series · 9 of 28 positions shown · non-contrast
Comparison: Previous exam(s).

CLINICAL DATA: Screening.

EXAM:
2D DIGITAL SCREENING BILATERAL MAMMOGRAM WITH CAD AND ADJUNCT TOMO

[R MLO]
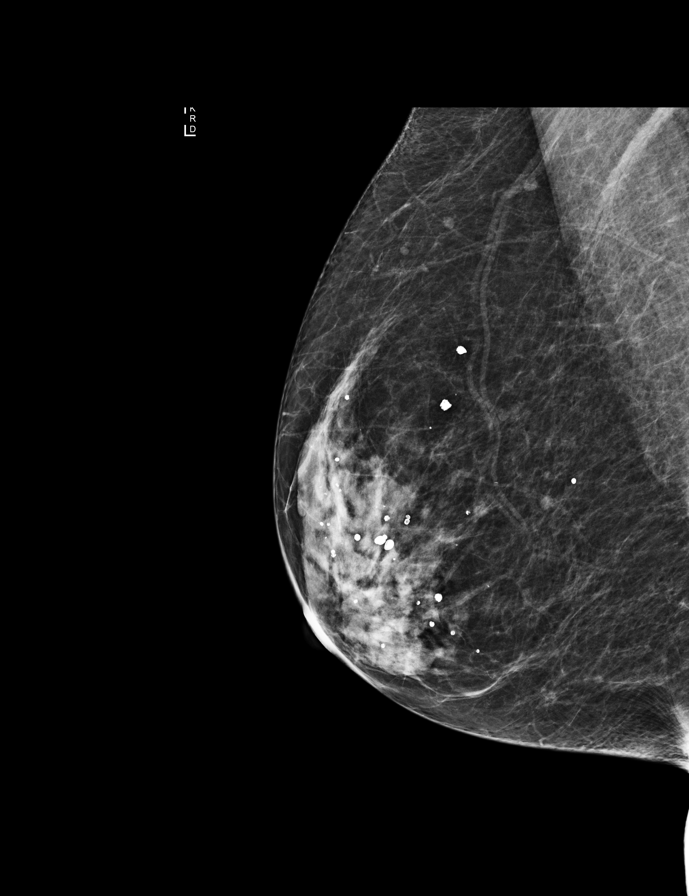

[L MLO]
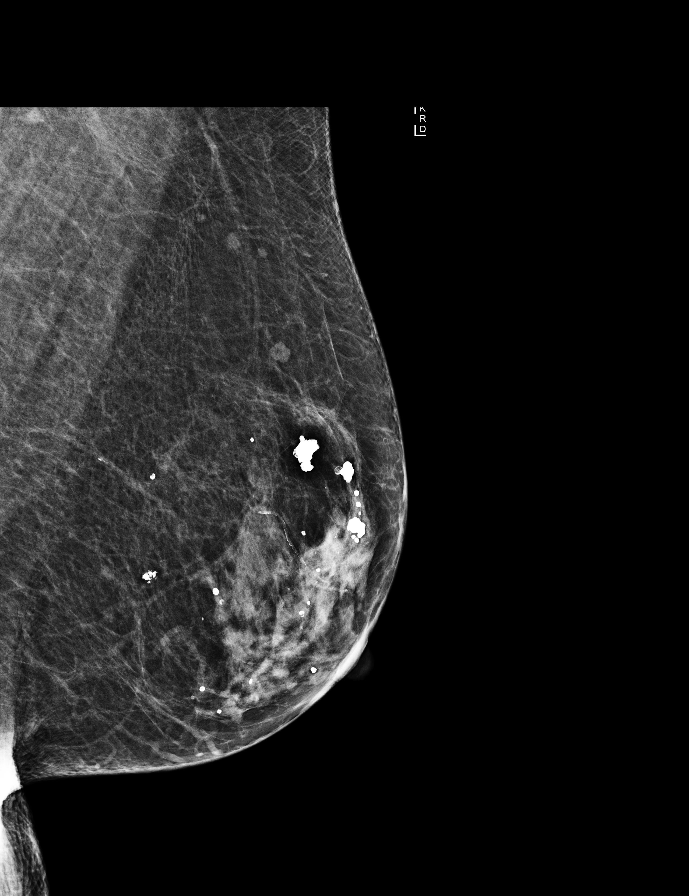

[L MLO synth-2D]
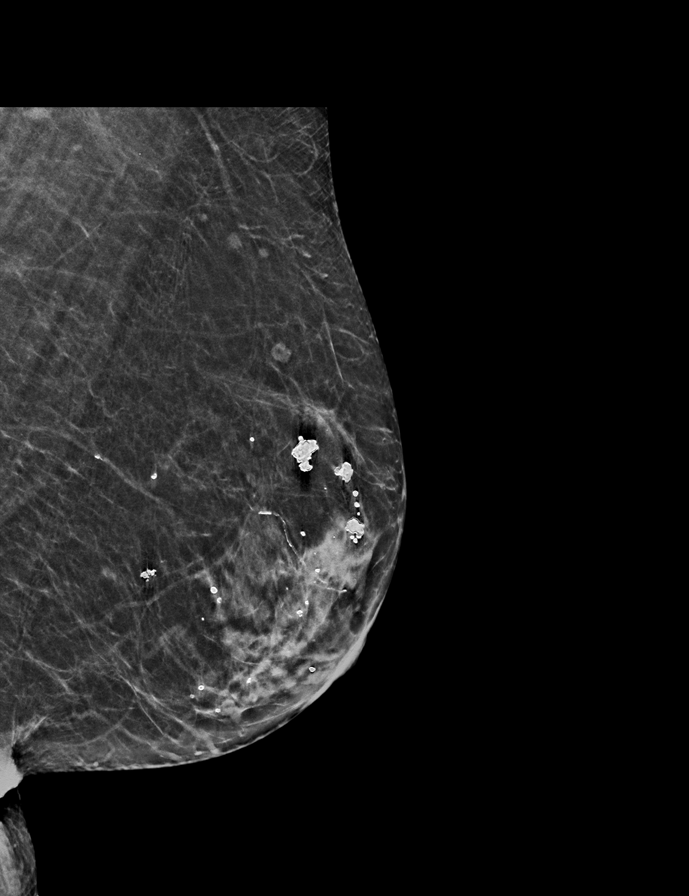

[R CC synth-2D]
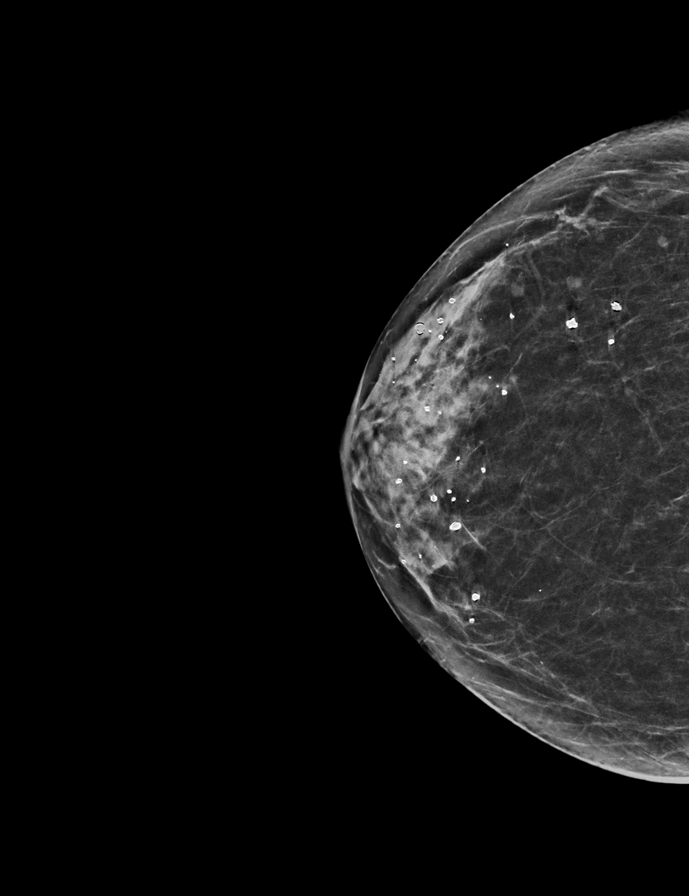

[R CC]
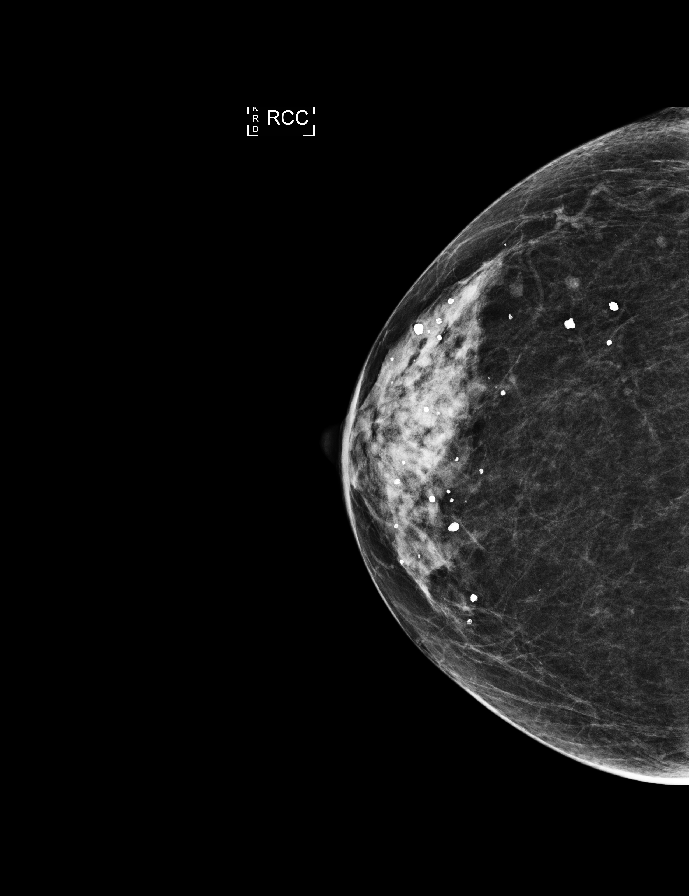

[R MLO synth-2D]
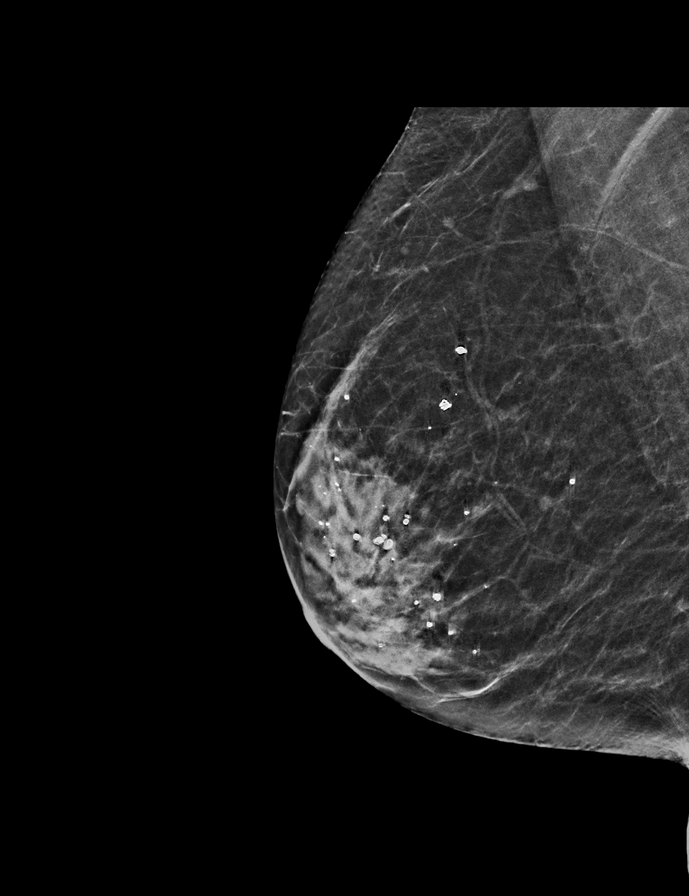

[L CC]
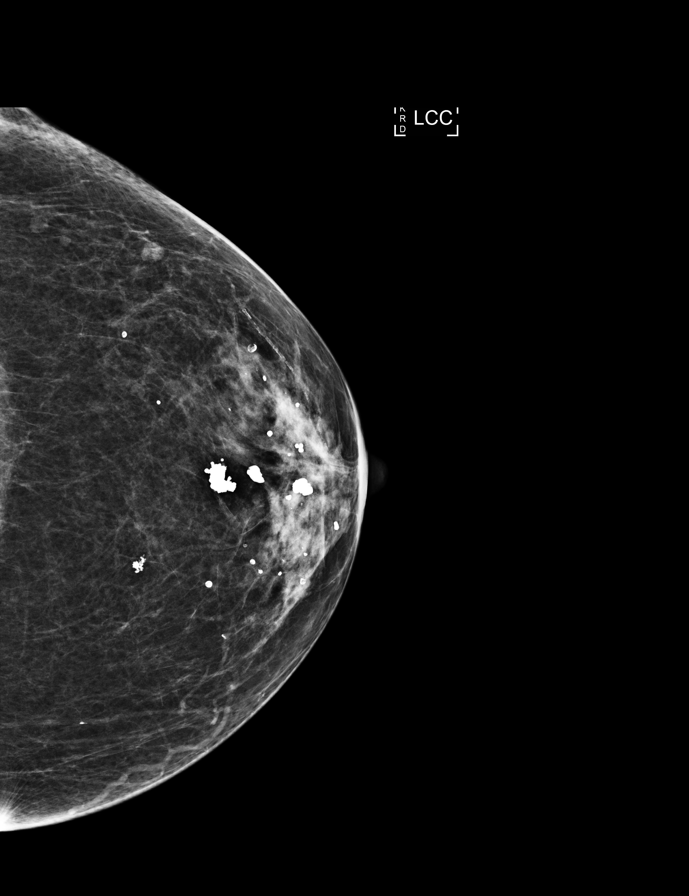

[L CC synth-2D]
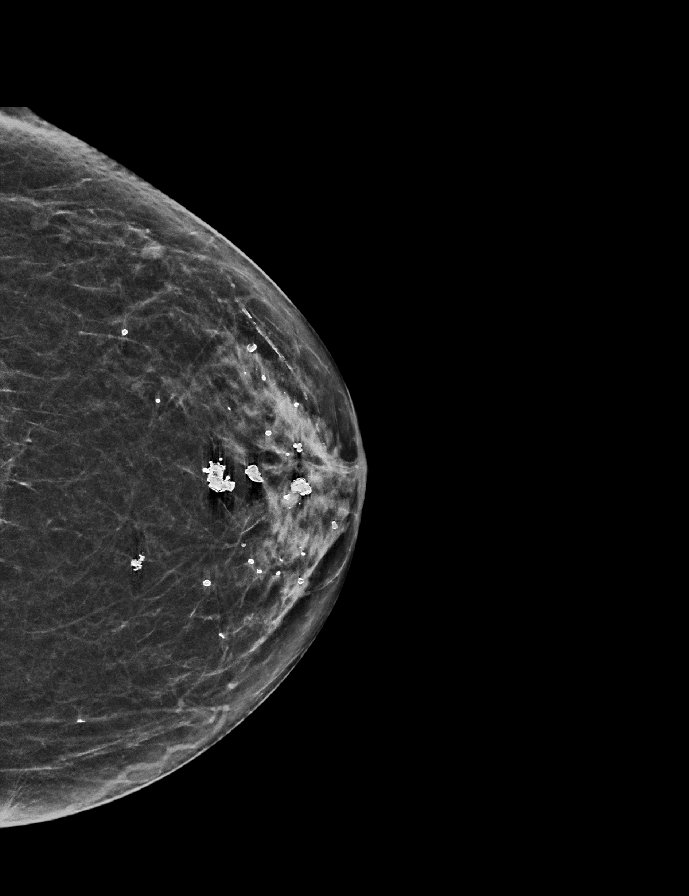

[L MLO tomo · tomo slice 27/52.0]
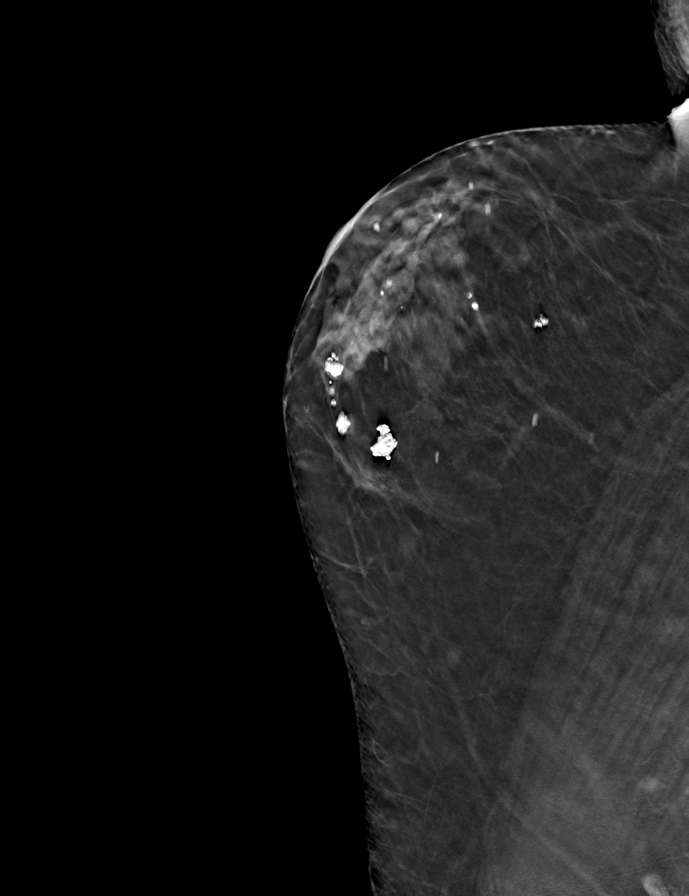

[9 of 28 positions shown; findings below may reference images not displayed]

ACR Breast Density Category c: The breast tissue is heterogeneously
dense, which may obscure small masses.
FINDINGS: There are no findings suspicious for malignancy. Images were
processed with CAD.
IMPRESSION: No mammographic evidence of malignancy. A result letter of this
screening mammogram will be mailed directly to the patient.

RECOMMENDATION:
Screening mammogram in one year. (Code:[id number])

BI-RADS CATEGORY  1: Negative.

## 2019-09-06 ENCOUNTER — Ambulatory Visit (INDEPENDENT_AMBULATORY_CARE_PROVIDER_SITE_OTHER): Payer: Medicare Other

## 2019-09-06 ENCOUNTER — Other Ambulatory Visit: Payer: Self-pay

## 2019-09-06 DIAGNOSIS — Z23 Encounter for immunization: Secondary | ICD-10-CM

## 2019-09-28 ENCOUNTER — Ambulatory Visit
Admission: RE | Admit: 2019-09-28 | Discharge: 2019-09-28 | Disposition: A | Discharge status: Home / Self Care | Payer: Medicare Other | Source: Ambulatory Visit | Attending: General Surgery | Admitting: General Surgery

## 2019-09-28 DIAGNOSIS — Z1231 Encounter for screening mammogram for malignant neoplasm of breast: Secondary | ICD-10-CM

## 2019-12-16 ENCOUNTER — Other Ambulatory Visit: Payer: Self-pay

## 2019-12-16 ENCOUNTER — Encounter (INDEPENDENT_AMBULATORY_CARE_PROVIDER_SITE_OTHER): Payer: Medicare Other | Admitting: Ophthalmology

## 2019-12-16 DIAGNOSIS — I10 Essential (primary) hypertension: Secondary | ICD-10-CM

## 2019-12-16 DIAGNOSIS — H43813 Vitreous degeneration, bilateral: Secondary | ICD-10-CM

## 2019-12-16 DIAGNOSIS — H35033 Hypertensive retinopathy, bilateral: Secondary | ICD-10-CM | POA: Diagnosis not present

## 2019-12-16 DIAGNOSIS — D3132 Benign neoplasm of left choroid: Secondary | ICD-10-CM

## 2019-12-16 DIAGNOSIS — H59032 Cystoid macular edema following cataract surgery, left eye: Secondary | ICD-10-CM | POA: Diagnosis not present

## 2019-12-16 DIAGNOSIS — H35373 Puckering of macula, bilateral: Secondary | ICD-10-CM | POA: Diagnosis not present

## 2020-05-16 ENCOUNTER — Encounter (INDEPENDENT_AMBULATORY_CARE_PROVIDER_SITE_OTHER): Payer: Medicare Other | Admitting: Ophthalmology

## 2020-05-16 ENCOUNTER — Other Ambulatory Visit: Payer: Self-pay

## 2020-05-16 DIAGNOSIS — H35033 Hypertensive retinopathy, bilateral: Secondary | ICD-10-CM

## 2020-05-16 DIAGNOSIS — H26492 Other secondary cataract, left eye: Secondary | ICD-10-CM | POA: Diagnosis not present

## 2020-05-16 DIAGNOSIS — H59032 Cystoid macular edema following cataract surgery, left eye: Secondary | ICD-10-CM | POA: Diagnosis not present

## 2020-05-16 DIAGNOSIS — H43813 Vitreous degeneration, bilateral: Secondary | ICD-10-CM

## 2020-05-16 DIAGNOSIS — I10 Essential (primary) hypertension: Secondary | ICD-10-CM

## 2020-05-16 DIAGNOSIS — H353132 Nonexudative age-related macular degeneration, bilateral, intermediate dry stage: Secondary | ICD-10-CM | POA: Diagnosis not present

## 2020-05-16 DIAGNOSIS — D3132 Benign neoplasm of left choroid: Secondary | ICD-10-CM

## 2020-05-16 DIAGNOSIS — H2511 Age-related nuclear cataract, right eye: Secondary | ICD-10-CM

## 2020-06-04 ENCOUNTER — Encounter (INDEPENDENT_AMBULATORY_CARE_PROVIDER_SITE_OTHER): Payer: Medicare Other | Admitting: Ophthalmology

## 2020-06-04 ENCOUNTER — Other Ambulatory Visit: Payer: Self-pay

## 2020-06-04 DIAGNOSIS — H2702 Aphakia, left eye: Secondary | ICD-10-CM

## 2020-08-20 ENCOUNTER — Other Ambulatory Visit: Payer: Self-pay | Admitting: General Surgery

## 2020-08-20 DIAGNOSIS — N6012 Diffuse cystic mastopathy of left breast: Secondary | ICD-10-CM

## 2020-08-20 DIAGNOSIS — Z1231 Encounter for screening mammogram for malignant neoplasm of breast: Secondary | ICD-10-CM

## 2020-08-20 DIAGNOSIS — N6011 Diffuse cystic mastopathy of right breast: Secondary | ICD-10-CM

## 2020-10-02 ENCOUNTER — Other Ambulatory Visit: Payer: Self-pay

## 2020-10-02 ENCOUNTER — Ambulatory Visit
Admission: RE | Admit: 2020-10-02 | Discharge: 2020-10-02 | Disposition: A | Discharge status: Home / Self Care | Payer: Medicare Other | Source: Ambulatory Visit | Attending: General Surgery | Admitting: General Surgery

## 2020-10-02 DIAGNOSIS — Z1231 Encounter for screening mammogram for malignant neoplasm of breast: Secondary | ICD-10-CM | POA: Insufficient documentation

## 2020-10-02 DIAGNOSIS — N6012 Diffuse cystic mastopathy of left breast: Secondary | ICD-10-CM

## 2020-10-02 DIAGNOSIS — N6011 Diffuse cystic mastopathy of right breast: Secondary | ICD-10-CM | POA: Insufficient documentation

## 2020-11-12 ENCOUNTER — Encounter (INDEPENDENT_AMBULATORY_CARE_PROVIDER_SITE_OTHER): Payer: Medicare Other | Admitting: Ophthalmology

## 2020-12-04 ENCOUNTER — Other Ambulatory Visit: Payer: Self-pay

## 2020-12-04 ENCOUNTER — Encounter (INDEPENDENT_AMBULATORY_CARE_PROVIDER_SITE_OTHER): Payer: Medicare Other | Admitting: Ophthalmology

## 2020-12-04 DIAGNOSIS — H35033 Hypertensive retinopathy, bilateral: Secondary | ICD-10-CM | POA: Diagnosis not present

## 2020-12-04 DIAGNOSIS — I10 Essential (primary) hypertension: Secondary | ICD-10-CM

## 2020-12-04 DIAGNOSIS — H353132 Nonexudative age-related macular degeneration, bilateral, intermediate dry stage: Secondary | ICD-10-CM

## 2020-12-04 DIAGNOSIS — H59032 Cystoid macular edema following cataract surgery, left eye: Secondary | ICD-10-CM

## 2020-12-04 DIAGNOSIS — H43813 Vitreous degeneration, bilateral: Secondary | ICD-10-CM

## 2020-12-04 DIAGNOSIS — H35372 Puckering of macula, left eye: Secondary | ICD-10-CM

## 2020-12-04 DIAGNOSIS — D3132 Benign neoplasm of left choroid: Secondary | ICD-10-CM

## 2020-12-04 DIAGNOSIS — H2511 Age-related nuclear cataract, right eye: Secondary | ICD-10-CM

## 2021-04-22 ENCOUNTER — Other Ambulatory Visit: Payer: Self-pay

## 2021-04-22 NOTE — ED Triage Notes (Deleted)
One week of left/feet edema, shob, pt had shingles a month ago. Confusion chronic, but has become worse.

## 2021-06-03 ENCOUNTER — Encounter (INDEPENDENT_AMBULATORY_CARE_PROVIDER_SITE_OTHER): Payer: Medicare Other | Admitting: Ophthalmology

## 2021-06-03 ENCOUNTER — Other Ambulatory Visit: Payer: Self-pay

## 2021-06-03 DIAGNOSIS — H353132 Nonexudative age-related macular degeneration, bilateral, intermediate dry stage: Secondary | ICD-10-CM | POA: Diagnosis not present

## 2021-06-03 DIAGNOSIS — H35373 Puckering of macula, bilateral: Secondary | ICD-10-CM

## 2021-06-03 DIAGNOSIS — I10 Essential (primary) hypertension: Secondary | ICD-10-CM | POA: Diagnosis not present

## 2021-06-03 DIAGNOSIS — H59031 Cystoid macular edema following cataract surgery, right eye: Secondary | ICD-10-CM | POA: Diagnosis not present

## 2021-06-03 DIAGNOSIS — H43813 Vitreous degeneration, bilateral: Secondary | ICD-10-CM

## 2021-06-03 DIAGNOSIS — H35033 Hypertensive retinopathy, bilateral: Secondary | ICD-10-CM

## 2021-06-03 DIAGNOSIS — D3132 Benign neoplasm of left choroid: Secondary | ICD-10-CM

## 2021-08-19 ENCOUNTER — Other Ambulatory Visit: Payer: Self-pay | Admitting: General Surgery

## 2021-08-19 DIAGNOSIS — Z1231 Encounter for screening mammogram for malignant neoplasm of breast: Secondary | ICD-10-CM

## 2021-10-07 ENCOUNTER — Other Ambulatory Visit: Payer: Self-pay

## 2021-10-07 ENCOUNTER — Ambulatory Visit
Admission: RE | Admit: 2021-10-07 | Discharge: 2021-10-07 | Disposition: A | Discharge status: Home / Self Care | Payer: Medicare Other | Source: Ambulatory Visit | Attending: General Surgery | Admitting: General Surgery

## 2021-10-07 DIAGNOSIS — Z1231 Encounter for screening mammogram for malignant neoplasm of breast: Secondary | ICD-10-CM | POA: Diagnosis not present

## 2021-12-04 ENCOUNTER — Encounter (INDEPENDENT_AMBULATORY_CARE_PROVIDER_SITE_OTHER): Payer: Medicare Other | Admitting: Ophthalmology

## 2021-12-04 ENCOUNTER — Other Ambulatory Visit: Payer: Self-pay

## 2021-12-04 DIAGNOSIS — H35372 Puckering of macula, left eye: Secondary | ICD-10-CM

## 2021-12-04 DIAGNOSIS — I10 Essential (primary) hypertension: Secondary | ICD-10-CM

## 2021-12-04 DIAGNOSIS — H353132 Nonexudative age-related macular degeneration, bilateral, intermediate dry stage: Secondary | ICD-10-CM | POA: Diagnosis not present

## 2021-12-04 DIAGNOSIS — H43813 Vitreous degeneration, bilateral: Secondary | ICD-10-CM

## 2021-12-04 DIAGNOSIS — D3132 Benign neoplasm of left choroid: Secondary | ICD-10-CM

## 2021-12-04 DIAGNOSIS — H35033 Hypertensive retinopathy, bilateral: Secondary | ICD-10-CM

## 2022-06-04 ENCOUNTER — Encounter (INDEPENDENT_AMBULATORY_CARE_PROVIDER_SITE_OTHER): Payer: Medicare Other | Admitting: Ophthalmology

## 2022-06-18 ENCOUNTER — Encounter (INDEPENDENT_AMBULATORY_CARE_PROVIDER_SITE_OTHER): Payer: Medicare Other | Admitting: Ophthalmology

## 2022-06-18 DIAGNOSIS — H353221 Exudative age-related macular degeneration, left eye, with active choroidal neovascularization: Secondary | ICD-10-CM | POA: Diagnosis not present

## 2022-06-18 DIAGNOSIS — D3132 Benign neoplasm of left choroid: Secondary | ICD-10-CM

## 2022-06-18 DIAGNOSIS — H33302 Unspecified retinal break, left eye: Secondary | ICD-10-CM

## 2022-06-18 DIAGNOSIS — I10 Essential (primary) hypertension: Secondary | ICD-10-CM | POA: Diagnosis not present

## 2022-06-18 DIAGNOSIS — H353112 Nonexudative age-related macular degeneration, right eye, intermediate dry stage: Secondary | ICD-10-CM | POA: Diagnosis not present

## 2022-06-18 DIAGNOSIS — H35033 Hypertensive retinopathy, bilateral: Secondary | ICD-10-CM

## 2022-06-18 DIAGNOSIS — H43813 Vitreous degeneration, bilateral: Secondary | ICD-10-CM

## 2022-08-15 ENCOUNTER — Other Ambulatory Visit: Payer: Self-pay | Admitting: General Surgery

## 2022-08-15 DIAGNOSIS — Z1231 Encounter for screening mammogram for malignant neoplasm of breast: Secondary | ICD-10-CM

## 2022-08-28 ENCOUNTER — Other Ambulatory Visit: Payer: Self-pay | Admitting: General Surgery

## 2022-08-28 DIAGNOSIS — N6012 Diffuse cystic mastopathy of left breast: Secondary | ICD-10-CM

## 2022-09-01 ENCOUNTER — Other Ambulatory Visit: Payer: Self-pay | Admitting: Family Medicine

## 2022-09-01 DIAGNOSIS — R9389 Abnormal findings on diagnostic imaging of other specified body structures: Secondary | ICD-10-CM

## 2022-09-12 ENCOUNTER — Ambulatory Visit: Payer: Medicare Other

## 2022-09-22 ENCOUNTER — Other Ambulatory Visit: Payer: Self-pay | Admitting: General Surgery

## 2022-09-22 DIAGNOSIS — N6012 Diffuse cystic mastopathy of left breast: Secondary | ICD-10-CM

## 2022-10-08 ENCOUNTER — Other Ambulatory Visit: Payer: Medicare Other

## 2022-10-17 ENCOUNTER — Ambulatory Visit
Admission: RE | Admit: 2022-10-17 | Discharge: 2022-10-17 | Disposition: A | Discharge status: Home / Self Care | Payer: Medicare Other | Source: Ambulatory Visit | Attending: Family Medicine | Admitting: Family Medicine

## 2022-10-17 DIAGNOSIS — R9389 Abnormal findings on diagnostic imaging of other specified body structures: Secondary | ICD-10-CM

## 2022-10-21 ENCOUNTER — Ambulatory Visit
Admission: RE | Admit: 2022-10-21 | Discharge: 2022-10-21 | Disposition: A | Discharge status: Home / Self Care | Payer: Medicare Other | Source: Ambulatory Visit | Attending: General Surgery | Admitting: General Surgery

## 2022-10-21 DIAGNOSIS — N6012 Diffuse cystic mastopathy of left breast: Secondary | ICD-10-CM | POA: Diagnosis present

## 2022-10-21 DIAGNOSIS — Z1231 Encounter for screening mammogram for malignant neoplasm of breast: Secondary | ICD-10-CM | POA: Insufficient documentation

## 2022-11-28 ENCOUNTER — Other Ambulatory Visit: Payer: Self-pay | Admitting: Family Medicine

## 2022-11-28 DIAGNOSIS — I2510 Atherosclerotic heart disease of native coronary artery without angina pectoris: Secondary | ICD-10-CM

## 2022-12-12 ENCOUNTER — Ambulatory Visit: Admission: RE | Admit: 2022-12-12 | Payer: Medicare Other | Source: Ambulatory Visit

## 2022-12-17 ENCOUNTER — Encounter (INDEPENDENT_AMBULATORY_CARE_PROVIDER_SITE_OTHER): Payer: Medicare Other | Admitting: Ophthalmology

## 2022-12-17 DIAGNOSIS — H35372 Puckering of macula, left eye: Secondary | ICD-10-CM | POA: Diagnosis not present

## 2022-12-17 DIAGNOSIS — H35033 Hypertensive retinopathy, bilateral: Secondary | ICD-10-CM | POA: Diagnosis not present

## 2022-12-17 DIAGNOSIS — I10 Essential (primary) hypertension: Secondary | ICD-10-CM

## 2022-12-17 DIAGNOSIS — D3132 Benign neoplasm of left choroid: Secondary | ICD-10-CM

## 2022-12-17 DIAGNOSIS — H353132 Nonexudative age-related macular degeneration, bilateral, intermediate dry stage: Secondary | ICD-10-CM

## 2022-12-17 DIAGNOSIS — H43813 Vitreous degeneration, bilateral: Secondary | ICD-10-CM

## 2023-06-17 ENCOUNTER — Encounter (INDEPENDENT_AMBULATORY_CARE_PROVIDER_SITE_OTHER): Payer: Medicare Other | Admitting: Ophthalmology

## 2023-06-17 DIAGNOSIS — D3132 Benign neoplasm of left choroid: Secondary | ICD-10-CM

## 2023-06-17 DIAGNOSIS — H43813 Vitreous degeneration, bilateral: Secondary | ICD-10-CM

## 2023-06-17 DIAGNOSIS — H353112 Nonexudative age-related macular degeneration, right eye, intermediate dry stage: Secondary | ICD-10-CM | POA: Diagnosis not present

## 2023-06-17 DIAGNOSIS — H353221 Exudative age-related macular degeneration, left eye, with active choroidal neovascularization: Secondary | ICD-10-CM

## 2023-06-17 DIAGNOSIS — H35033 Hypertensive retinopathy, bilateral: Secondary | ICD-10-CM | POA: Diagnosis not present

## 2023-06-17 DIAGNOSIS — I10 Essential (primary) hypertension: Secondary | ICD-10-CM | POA: Diagnosis not present

## 2023-09-10 ENCOUNTER — Other Ambulatory Visit: Payer: Self-pay | Admitting: General Surgery

## 2023-09-10 DIAGNOSIS — Z1231 Encounter for screening mammogram for malignant neoplasm of breast: Secondary | ICD-10-CM

## 2023-12-18 ENCOUNTER — Encounter (INDEPENDENT_AMBULATORY_CARE_PROVIDER_SITE_OTHER): Payer: Medicare Other | Admitting: Ophthalmology

## 2023-12-18 DIAGNOSIS — H35033 Hypertensive retinopathy, bilateral: Secondary | ICD-10-CM | POA: Diagnosis not present

## 2023-12-18 DIAGNOSIS — H353221 Exudative age-related macular degeneration, left eye, with active choroidal neovascularization: Secondary | ICD-10-CM

## 2023-12-18 DIAGNOSIS — I10 Essential (primary) hypertension: Secondary | ICD-10-CM | POA: Diagnosis not present

## 2023-12-18 DIAGNOSIS — H35372 Puckering of macula, left eye: Secondary | ICD-10-CM

## 2023-12-18 DIAGNOSIS — H353112 Nonexudative age-related macular degeneration, right eye, intermediate dry stage: Secondary | ICD-10-CM

## 2023-12-18 DIAGNOSIS — H2511 Age-related nuclear cataract, right eye: Secondary | ICD-10-CM

## 2023-12-18 DIAGNOSIS — H43813 Vitreous degeneration, bilateral: Secondary | ICD-10-CM

## 2023-12-18 DIAGNOSIS — D3132 Benign neoplasm of left choroid: Secondary | ICD-10-CM

## 2023-12-20 ENCOUNTER — Emergency Department
Admission: EM | Admit: 2023-12-20 | Discharge: 2023-12-20 | Disposition: A | Discharge status: Home / Self Care | Payer: Medicare Other | Attending: Emergency Medicine | Admitting: Emergency Medicine

## 2023-12-20 ENCOUNTER — Emergency Department: Payer: Medicare Other

## 2023-12-20 ENCOUNTER — Other Ambulatory Visit: Payer: Self-pay

## 2023-12-20 DIAGNOSIS — I6782 Cerebral ischemia: Secondary | ICD-10-CM | POA: Diagnosis not present

## 2023-12-20 DIAGNOSIS — H538 Other visual disturbances: Secondary | ICD-10-CM | POA: Diagnosis not present

## 2023-12-20 DIAGNOSIS — R519 Headache, unspecified: Secondary | ICD-10-CM | POA: Insufficient documentation

## 2023-12-20 DIAGNOSIS — I10 Essential (primary) hypertension: Secondary | ICD-10-CM | POA: Diagnosis not present

## 2023-12-20 DIAGNOSIS — Z79899 Other long term (current) drug therapy: Secondary | ICD-10-CM | POA: Diagnosis not present

## 2023-12-20 DIAGNOSIS — R9082 White matter disease, unspecified: Secondary | ICD-10-CM | POA: Diagnosis not present

## 2023-12-20 LAB — COMPREHENSIVE METABOLIC PANEL
ALT: 35 U/L (ref 0–44)
AST: 49 U/L — ABNORMAL HIGH (ref 15–41)
Albumin: 3.8 g/dL (ref 3.5–5.0)
Alkaline Phosphatase: 44 U/L (ref 38–126)
Anion gap: 10 (ref 5–15)
BUN: 16 mg/dL (ref 8–23)
CO2: 30 mmol/L (ref 22–32)
Calcium: 9.7 mg/dL (ref 8.9–10.3)
Chloride: 101 mmol/L (ref 98–111)
Creatinine, Ser: 0.61 mg/dL (ref 0.44–1.00)
GFR, Estimated: >60 mL/min (ref >60)
Glucose, Bld: 94 mg/dL (ref 70–99)
Potassium: 3.4 mmol/L — ABNORMAL LOW (ref 3.5–5.1)
Sodium: 141 mmol/L (ref 135–145)
Total Bilirubin: 1.1 mg/dL (ref 0.0–1.2)
Total Protein: 6.1 g/dL — ABNORMAL LOW (ref 6.5–8.1)

## 2023-12-20 LAB — CBC WITH DIFFERENTIAL/PLATELET
Abs Immature Granulocytes: 0.01 10*3/uL (ref 0.00–0.07)
Basophils Absolute: 0 10*3/uL (ref 0.0–0.1)
Basophils Relative: 1 %
Eosinophils Absolute: 0 10*3/uL (ref 0.0–0.5)
Eosinophils Relative: 1 %
HCT: 34.2 % — ABNORMAL LOW (ref 36.0–46.0)
Hemoglobin: 11.3 g/dL — ABNORMAL LOW (ref 12.0–15.0)
Immature Granulocytes: 0 %
Lymphocytes Relative: 22 %
Lymphs Abs: 1.3 10*3/uL (ref 0.7–4.0)
MCH: 32 pg (ref 26.0–34.0)
MCHC: 33 g/dL (ref 30.0–36.0)
MCV: 96.9 fL (ref 80.0–100.0)
Monocytes Absolute: 0.3 10*3/uL (ref 0.1–1.0)
Monocytes Relative: 6 %
Neutro Abs: 4 10*3/uL (ref 1.7–7.7)
Neutrophils Relative %: 70 %
Platelets: 178 10*3/uL (ref 150–400)
RBC: 3.53 MIL/uL — ABNORMAL LOW (ref 3.87–5.11)
RDW: 14.2 % (ref 11.5–15.5)
WBC: 5.6 10*3/uL (ref 4.0–10.5)
nRBC: 0 % (ref 0.0–0.2)

## 2023-12-20 LAB — APTT: aPTT: 34 s (ref 24–36)

## 2023-12-20 LAB — PROTIME-INR
INR: 0.9 (ref 0.8–1.2)
Prothrombin Time: 12.6 s (ref 11.4–15.2)

## 2023-12-20 MED ORDER — AMLODIPINE BESYLATE 5 MG PO TABS: 5.0000 mg | ORAL_TABLET | Freq: Every day | ORAL | 5 refills | Status: AC

## 2023-12-20 MED ORDER — CLONIDINE HCL 0.1 MG PO TABS
0.1000 mg | ORAL_TABLET | Freq: Once | ORAL | Status: AC
  Administered 2023-12-20: 0.1 mg via ORAL
  Filled 2023-12-20: qty 1

## 2023-12-20 NOTE — ED Notes (Signed)
 Patient transported to MRI

## 2023-12-20 NOTE — ED Provider Notes (Signed)
 [facility] Provider Note    Event Date/Time   First MD Initiated Contact with Patient 12/20/23 1216     (approximate)  History   Chief Complaint: Hypertension and Transient Ischemic Attack  HPI  FADUMA CHO is a 89 y.o. female with a past medical history of hypertension not currently prescribed any medications, presents to the emergency department with complaints of high blood pressure and blurred vision and headaches.  No according to the patient intermittently over the last 3 to 4 months she has been experiencing symptoms which she describes as her vision turning blurry and developing headache.  Patient states yesterday her symptoms seem to be worse, she was concerned so she checked her blood pressure and saw readings in the 190s to 200 at home.  Patient states she took one of her husbands lisinopril tablets that she is not prescribed any blood pressure medications.  This morning continued to have a headache and the blood pressure continue to read elevated so the patient came to the emergency department.  Patient describes the headache lasting for 20 to 30 minutes along with some blurred vision when this occurs and then seems to resolve on its own.  Patient denies any weakness or numbness of any arm or leg any trouble speaking or thinking during these events.  No history of CVA previously.  Patient denies any headache or blurred vision currently.  Patient's blood pressure remains elevated 192/85 currently.  Physical Exam   Triage Vital Signs: ED Triage Vitals  Encounter Vitals Group     BP 12/20/23 1141 (!) 201/85     Systolic BP Percentile --      Diastolic BP Percentile --      Pulse Rate 12/20/23 1141 80     Resp 12/20/23 1141 20     Temp 12/20/23 1141 98.7 F (37.1 C)     Temp Source 12/20/23 1141 Oral     SpO2 12/20/23 1141 96 %     Weight 12/20/23 1143 118 lb (53.5 kg)     Height 12/20/23 1143 5' 6 (1.676 m)     Head Circumference --      Peak  Flow --      Pain Score 12/20/23 1139 2     Pain Loc --      Pain Education --      Exclude from Growth Chart --     Most recent vital signs: Vitals:   12/20/23 1141 12/20/23 1230  BP: (!) 201/85 (!) 192/85  Pulse: 80 79  Resp: 20 13  Temp: 98.7 F (37.1 C)   SpO2: 96% 100%    General: Awake, no distress.  CV:  Good peripheral perfusion.  Regular rate and rhythm  Resp:  Normal effort.  Equal breath sounds bilaterally.  Abd:  No distention.  Soft, nontender.  No rebound or guarding. Other:  Equal grip strength bilaterally, no pronator drift, 5/5 motor in all extremities.  No cranial nerve deficits on exam.   ED Results / Procedures / Treatments   EKG  EKG viewed and interpreted by myself shows a normal sinus rhythm at 75 bpm with a widened QRS, left axis deviation, largely normal intervals, most consistent with left bundle branch block.  No concerning ST elevation.  RADIOLOGY  I have reviewed and interpreted CT head images.  No concerning abnormality seen on my evaluation. Radiology is read the CT scan is negative.   MEDICATIONS ORDERED IN ED: Medications - No data to display  IMPRESSION / MDM / ASSESSMENT AND PLAN / ED COURSE  I reviewed the triage vital signs and the nursing notes.  Patient's presentation is most consistent with acute presentation with potential threat to life or bodily function.  Patient presents to the emergency department for intermittent hypertension and blurred vision she states the symptoms have been ongoing x 3 months or so however she took her blood pressure last night and it was very elevated as high as 200.  Had an episode again this morning of headache and blurred vision so centimeter to the emergency department for evaluation.  Currently the patient appears well denies any symptoms.  Patient CBC is reassuring with a normal white blood cell count, chemistry reassuring.  Patient CT scan of the head shows no concerning finding and EKG  reassuring.  Will dose 0.1 mg of clonidine  to see if we can bring down the patient's blood pressure somewhat.  We will proceed with an MRI of the brain to rule out any CVA.  Suspect the patient will need ongoing blood pressure management for her uncontrolled hypertension given no blood pressure medication currently.  Patient CT scan shows no acute finding.  Patient's MRI has resulted showing no significant finding.  Lab work is reassuring with normal CBC reassuring chemistry.  Patient's blood pressure is down to 171 systolic.  Given the patient's reassuring workup with reassuring MRI I believe the patient will be safe for discharge home with outpatient follow-up with her PCP.  However I did discuss with the patient we will need to start her on a blood pressure medication.  Will start the patient on amlodipine  5 mg daily.  Patient is to check her blood pressure every other day in the morning and discontinue this medication should her systolic drop less than 130.  Otherwise she will keep a log of her blood pressure medications and follow-up with her PCP for ongoing blood pressure management.  Patient and family member agreeable to plan.  FINAL CLINICAL IMPRESSION(S) / ED DIAGNOSES   Uncontrolled hypertension Blurry vision    Note:  This document was prepared using Dragon voice recognition software and may include unintentional dictation errors.   Dorothyann Drivers, MD 12/20/23 [phone]

## 2023-12-20 NOTE — Discharge Instructions (Addendum)
 As we discussed please begin taking your blood pressure medication 1 tablet each morning.  Please check your blood pressure every several days in the morning prior to taking your medication.  Keep a log of these readings to bring to your primary care doctor so they can help with blood pressure management.  If your blood pressure is ever less than 130 systolic (top number) please discontinue use of your blood pressure medication until you have seen your primary care doctor.  Return to the emergency department for any significant headache any weakness or numbness of any arm or leg confusion slurred speech or any other symptom personally concerning to yourself.

## 2023-12-20 NOTE — ED Notes (Signed)
 [employer] not working, using chart labels.

## 2023-12-20 NOTE — ED Triage Notes (Signed)
 Pt to ED for HTN, HA and vision changes since yesterday. HA is to the top of her head, like it's going to explode. Currently HA is 2/10. States since yesterday both eyes have been blurry. L eye is weaker than R eye in general. BP at home has been 187/99, 200/74, 196/79 and such. Also has been having intermittent L arm numbness. This morning had episode lasting 20 minutes. Pt is not on any medications for BP or otherwise. At this moment eyesight is normal. Pt has no arm drift or facial droop. Speech is clear. Discussed possibility of TIAs.

## 2024-01-11 ENCOUNTER — Ambulatory Visit
Admission: RE | Admit: 2024-01-11 | Discharge: 2024-01-11 | Disposition: A | Discharge status: Home / Self Care | Payer: Medicare Other | Source: Ambulatory Visit | Attending: General Surgery | Admitting: General Surgery

## 2024-01-11 DIAGNOSIS — Z1231 Encounter for screening mammogram for malignant neoplasm of breast: Secondary | ICD-10-CM | POA: Insufficient documentation

## 2024-03-14 ENCOUNTER — Encounter (INDEPENDENT_AMBULATORY_CARE_PROVIDER_SITE_OTHER): Admitting: Ophthalmology

## 2024-03-14 DIAGNOSIS — H43813 Vitreous degeneration, bilateral: Secondary | ICD-10-CM

## 2024-03-14 DIAGNOSIS — H35373 Puckering of macula, bilateral: Secondary | ICD-10-CM | POA: Diagnosis not present

## 2024-03-14 DIAGNOSIS — H353112 Nonexudative age-related macular degeneration, right eye, intermediate dry stage: Secondary | ICD-10-CM

## 2024-03-14 DIAGNOSIS — I10 Essential (primary) hypertension: Secondary | ICD-10-CM | POA: Diagnosis not present

## 2024-03-14 DIAGNOSIS — H35033 Hypertensive retinopathy, bilateral: Secondary | ICD-10-CM

## 2024-03-14 DIAGNOSIS — H2511 Age-related nuclear cataract, right eye: Secondary | ICD-10-CM

## 2024-03-14 DIAGNOSIS — H353221 Exudative age-related macular degeneration, left eye, with active choroidal neovascularization: Secondary | ICD-10-CM | POA: Diagnosis not present

## 2024-06-17 ENCOUNTER — Encounter (INDEPENDENT_AMBULATORY_CARE_PROVIDER_SITE_OTHER): Payer: Medicare Other | Admitting: Ophthalmology

## 2024-06-17 DIAGNOSIS — H353112 Nonexudative age-related macular degeneration, right eye, intermediate dry stage: Secondary | ICD-10-CM | POA: Diagnosis not present

## 2024-06-17 DIAGNOSIS — H35033 Hypertensive retinopathy, bilateral: Secondary | ICD-10-CM | POA: Diagnosis not present

## 2024-06-17 DIAGNOSIS — D3132 Benign neoplasm of left choroid: Secondary | ICD-10-CM

## 2024-06-17 DIAGNOSIS — H353221 Exudative age-related macular degeneration, left eye, with active choroidal neovascularization: Secondary | ICD-10-CM

## 2024-06-17 DIAGNOSIS — I10 Essential (primary) hypertension: Secondary | ICD-10-CM | POA: Diagnosis not present

## 2024-06-17 DIAGNOSIS — H35372 Puckering of macula, left eye: Secondary | ICD-10-CM

## 2024-06-17 DIAGNOSIS — H43813 Vitreous degeneration, bilateral: Secondary | ICD-10-CM

## 2024-12-14 ENCOUNTER — Other Ambulatory Visit: Payer: Self-pay | Admitting: General Surgery

## 2024-12-14 DIAGNOSIS — Z1231 Encounter for screening mammogram for malignant neoplasm of breast: Secondary | ICD-10-CM

## 2024-12-19 ENCOUNTER — Encounter (INDEPENDENT_AMBULATORY_CARE_PROVIDER_SITE_OTHER): Admitting: Ophthalmology

## 2025-01-13 ENCOUNTER — Encounter
